# Patient Record
Sex: Male | Born: 1975 | Race: Black or African American | Hispanic: No | Marital: Single | Smoking: Current every day smoker
Health system: Southern US, Community
[De-identification: ages and names within clinical notes are randomized; demographics above are authoritative.]

## PROBLEM LIST (undated history)

## (undated) VITALS — BP 122/92 | HR 90 | Temp 97.0°F | Resp 20 | Ht 75.0 in | Wt 265.0 lb

## (undated) DIAGNOSIS — F1010 Alcohol abuse, uncomplicated: Secondary | ICD-10-CM

## (undated) DIAGNOSIS — F32A Depression, unspecified: Secondary | ICD-10-CM

## (undated) DIAGNOSIS — F1910 Other psychoactive substance abuse, uncomplicated: Secondary | ICD-10-CM

## (undated) DIAGNOSIS — F4310 Post-traumatic stress disorder, unspecified: Secondary | ICD-10-CM

## (undated) DIAGNOSIS — F329 Major depressive disorder, single episode, unspecified: Secondary | ICD-10-CM

## (undated) DIAGNOSIS — I10 Essential (primary) hypertension: Secondary | ICD-10-CM

## (undated) DIAGNOSIS — E785 Hyperlipidemia, unspecified: Secondary | ICD-10-CM

## (undated) HISTORY — PX: HERNIA REPAIR: SHX51

---

## 2010-09-06 ENCOUNTER — Emergency Department (HOSPITAL_COMMUNITY)
Admission: EM | Admit: 2010-09-06 | Discharge: 2010-09-06 | Payer: Self-pay | Source: Home / Self Care | Admitting: Emergency Medicine

## 2010-11-11 ENCOUNTER — Emergency Department (HOSPITAL_COMMUNITY)
Admission: EM | Admit: 2010-11-11 | Discharge: 2010-11-11 | Disposition: A | Discharge status: Home / Self Care | Payer: Medicaid Other | Attending: Emergency Medicine | Admitting: Emergency Medicine

## 2010-11-11 DIAGNOSIS — R55 Syncope and collapse: Secondary | ICD-10-CM | POA: Insufficient documentation

## 2010-11-11 LAB — CBC
Hemoglobin: 13.1 g/dL (ref 13.0–17.0)
MCH: 28.5 pg (ref 26.0–34.0)
MCV: 88.7 fL (ref 78.0–100.0)
Platelets: 202 10*3/uL (ref 150–400)
RBC: 4.6 MIL/uL (ref 4.22–5.81)
WBC: 5.7 10*3/uL (ref 4.0–10.5)

## 2010-11-11 LAB — BASIC METABOLIC PANEL
CO2: 27 mEq/L (ref 19–32)
Chloride: 106 mEq/L (ref 96–112)
Creatinine, Ser: 1.42 mg/dL (ref 0.4–1.5)
GFR calc Af Amer: >60 mL/min (ref >60)
Potassium: 3.9 mEq/L (ref 3.5–5.1)

## 2013-01-15 ENCOUNTER — Emergency Department (HOSPITAL_COMMUNITY): Payer: Medicaid Other

## 2013-01-15 ENCOUNTER — Emergency Department (HOSPITAL_COMMUNITY)
Admission: EM | Admit: 2013-01-15 | Discharge: 2013-01-15 | Disposition: A | Discharge status: Home / Self Care | Payer: Medicaid Other | Attending: Emergency Medicine | Admitting: Emergency Medicine

## 2013-01-15 ENCOUNTER — Encounter (HOSPITAL_COMMUNITY): Payer: Self-pay | Admitting: Emergency Medicine

## 2013-01-15 DIAGNOSIS — Z8781 Personal history of (healed) traumatic fracture: Secondary | ICD-10-CM | POA: Insufficient documentation

## 2013-01-15 DIAGNOSIS — R209 Unspecified disturbances of skin sensation: Secondary | ICD-10-CM | POA: Insufficient documentation

## 2013-01-15 DIAGNOSIS — M545 Low back pain, unspecified: Secondary | ICD-10-CM | POA: Insufficient documentation

## 2013-01-15 DIAGNOSIS — M549 Dorsalgia, unspecified: Secondary | ICD-10-CM

## 2013-01-15 DIAGNOSIS — I10 Essential (primary) hypertension: Secondary | ICD-10-CM | POA: Insufficient documentation

## 2013-01-15 HISTORY — DX: Essential (primary) hypertension: I10

## 2013-01-15 MED ORDER — HYDROCODONE-ACETAMINOPHEN 5-325 MG PO TABS: 1.0000 | ORAL_TABLET | ORAL | Status: DC | PRN

## 2013-01-15 MED ORDER — METHOCARBAMOL 500 MG PO TABS: 500.0000 mg | ORAL_TABLET | Freq: Two times a day (BID) | ORAL | Status: DC

## 2013-01-15 MED ORDER — METHOCARBAMOL 500 MG PO TABS
500.0000 mg | ORAL_TABLET | Freq: Once | ORAL | Status: AC
  Administered 2013-01-15: 500 mg via ORAL
  Filled 2013-01-15: qty 1

## 2013-01-15 MED ORDER — PREDNISONE 20 MG PO TABS
60.0000 mg | ORAL_TABLET | Freq: Once | ORAL | Status: AC
  Administered 2013-01-15: 60 mg via ORAL
  Filled 2013-01-15: qty 3

## 2013-01-15 MED ORDER — OXYCODONE-ACETAMINOPHEN 5-325 MG PO TABS
2.0000 | ORAL_TABLET | Freq: Once | ORAL | Status: AC
  Administered 2013-01-15: 2 via ORAL
  Filled 2013-01-15: qty 2

## 2013-01-15 MED ORDER — HYDROCHLOROTHIAZIDE 12.5 MG PO TABS: 12.5000 mg | ORAL_TABLET | Freq: Every day | ORAL | Status: DC
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
## 2013-01-16 NOTE — ED Provider Notes (Signed)
Medical screening examination/treatment/procedure(s) were performed by non-physician practitioner and as supervising physician I was immediately available for consultation/collaboration.   Montarius Kitagawa III, MD 01/16/13 0825 

## 2013-02-12 ENCOUNTER — Encounter (HOSPITAL_COMMUNITY): Payer: Self-pay | Admitting: *Deleted

## 2013-02-12 ENCOUNTER — Emergency Department (HOSPITAL_COMMUNITY)
Admission: EM | Admit: 2013-02-12 | Discharge: 2013-02-12 | Disposition: A | Discharge status: Home / Self Care | Payer: Medicaid Other | Attending: Emergency Medicine | Admitting: Emergency Medicine

## 2013-02-12 DIAGNOSIS — IMO0002 Reserved for concepts with insufficient information to code with codable children: Secondary | ICD-10-CM | POA: Insufficient documentation

## 2013-02-12 DIAGNOSIS — F17200 Nicotine dependence, unspecified, uncomplicated: Secondary | ICD-10-CM | POA: Insufficient documentation

## 2013-02-12 DIAGNOSIS — M549 Dorsalgia, unspecified: Secondary | ICD-10-CM

## 2013-02-12 DIAGNOSIS — I10 Essential (primary) hypertension: Secondary | ICD-10-CM | POA: Insufficient documentation

## 2013-02-12 DIAGNOSIS — G8929 Other chronic pain: Secondary | ICD-10-CM | POA: Insufficient documentation

## 2013-02-12 DIAGNOSIS — Z79899 Other long term (current) drug therapy: Secondary | ICD-10-CM | POA: Insufficient documentation

## 2013-02-12 DIAGNOSIS — Y9389 Activity, other specified: Secondary | ICD-10-CM | POA: Insufficient documentation

## 2013-02-12 MED ORDER — DIAZEPAM 5 MG PO TABS: 5.0000 mg | ORAL_TABLET | Freq: Two times a day (BID) | ORAL | Status: DC

## 2013-02-12 MED ORDER — OXYCODONE-ACETAMINOPHEN 5-325 MG PO TABS: 1.0000 | ORAL_TABLET | Freq: Four times a day (QID) | ORAL | Status: DC | PRN
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
## 2013-02-12 NOTE — ED Provider Notes (Signed)
Medical screening examination/treatment/procedure(s) were performed by non-physician practitioner and as supervising physician I was immediately available for consultation/collaboration.    Arleen Bar R Darielle Hancher, MD 02/12/13 1602 

## 2013-03-05 ENCOUNTER — Encounter (HOSPITAL_COMMUNITY): Payer: Self-pay | Admitting: Emergency Medicine

## 2013-03-05 ENCOUNTER — Emergency Department (HOSPITAL_COMMUNITY)
Admission: EM | Admit: 2013-03-05 | Discharge: 2013-03-05 | Disposition: A | Discharge status: Home / Self Care | Payer: Medicaid Other | Attending: Emergency Medicine | Admitting: Emergency Medicine

## 2013-03-05 DIAGNOSIS — I10 Essential (primary) hypertension: Secondary | ICD-10-CM | POA: Insufficient documentation

## 2013-03-05 DIAGNOSIS — Z79899 Other long term (current) drug therapy: Secondary | ICD-10-CM | POA: Insufficient documentation

## 2013-03-05 DIAGNOSIS — Z87312 Personal history of (healed) stress fracture: Secondary | ICD-10-CM | POA: Insufficient documentation

## 2013-03-05 DIAGNOSIS — F17200 Nicotine dependence, unspecified, uncomplicated: Secondary | ICD-10-CM | POA: Insufficient documentation

## 2013-03-05 DIAGNOSIS — M549 Dorsalgia, unspecified: Secondary | ICD-10-CM

## 2013-03-05 DIAGNOSIS — M6281 Muscle weakness (generalized): Secondary | ICD-10-CM | POA: Insufficient documentation

## 2013-03-05 DIAGNOSIS — K6289 Other specified diseases of anus and rectum: Secondary | ICD-10-CM | POA: Insufficient documentation

## 2013-03-05 DIAGNOSIS — R634 Abnormal weight loss: Secondary | ICD-10-CM | POA: Insufficient documentation

## 2013-03-05 DIAGNOSIS — R209 Unspecified disturbances of skin sensation: Secondary | ICD-10-CM | POA: Insufficient documentation

## 2013-03-05 DIAGNOSIS — M79609 Pain in unspecified limb: Secondary | ICD-10-CM | POA: Insufficient documentation

## 2013-03-05 DIAGNOSIS — G479 Sleep disorder, unspecified: Secondary | ICD-10-CM | POA: Insufficient documentation

## 2013-03-05 DIAGNOSIS — K921 Melena: Secondary | ICD-10-CM | POA: Insufficient documentation

## 2013-03-05 DIAGNOSIS — R638 Other symptoms and signs concerning food and fluid intake: Secondary | ICD-10-CM | POA: Insufficient documentation

## 2013-03-05 MED ORDER — OXYCODONE-ACETAMINOPHEN 5-325 MG PO TABS: 1.0000 | ORAL_TABLET | Freq: Four times a day (QID) | ORAL | Status: DC | PRN

## 2013-03-05 MED ORDER — DIAZEPAM 5 MG PO TABS: 5.0000 mg | ORAL_TABLET | Freq: Four times a day (QID) | ORAL | Status: DC | PRN

## 2013-03-05 MED ORDER — DIAZEPAM 5 MG PO TABS
5.0000 mg | ORAL_TABLET | Freq: Once | ORAL | Status: AC
  Administered 2013-03-05: 5 mg via ORAL
  Filled 2013-03-05: qty 1

## 2013-03-05 MED ORDER — OXYCODONE-ACETAMINOPHEN 5-325 MG PO TABS
2.0000 | ORAL_TABLET | Freq: Once | ORAL | Status: AC
  Administered 2013-03-05: 2 via ORAL
  Filled 2013-03-05: qty 2

## 2013-03-05 NOTE — ED Provider Notes (Signed)
I saw and evaluated the patient, reviewed the resident's note and I agree with the findings and plan.  Patient here with muscle subtle back pain. No focal neurological findings. No recent injuries. We'll give symmetric tx   Toy Baker, MD 03/05/13 1000
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
## 2013-03-06 NOTE — ED Provider Notes (Signed)
I saw and evaluated the patient, reviewed the resident's note and I agree with the findings and plan.  Jahking Lesser T Zela Sobieski, MD 03/06/13 0758 

## 2013-03-23 ENCOUNTER — Emergency Department (HOSPITAL_COMMUNITY)
Admission: EM | Admit: 2013-03-23 | Discharge: 2013-03-24 | Disposition: A | Discharge status: Other Acute Inpatient Hospital | Payer: Medicaid Other | Attending: Emergency Medicine | Admitting: Emergency Medicine

## 2013-03-23 ENCOUNTER — Encounter (HOSPITAL_COMMUNITY): Payer: Self-pay | Admitting: Emergency Medicine

## 2013-03-23 DIAGNOSIS — F1010 Alcohol abuse, uncomplicated: Secondary | ICD-10-CM | POA: Insufficient documentation

## 2013-03-23 DIAGNOSIS — I10 Essential (primary) hypertension: Secondary | ICD-10-CM | POA: Insufficient documentation

## 2013-03-23 DIAGNOSIS — F3289 Other specified depressive episodes: Secondary | ICD-10-CM | POA: Insufficient documentation

## 2013-03-23 DIAGNOSIS — R1013 Epigastric pain: Secondary | ICD-10-CM | POA: Insufficient documentation

## 2013-03-23 DIAGNOSIS — Z9889 Other specified postprocedural states: Secondary | ICD-10-CM | POA: Insufficient documentation

## 2013-03-23 DIAGNOSIS — F17200 Nicotine dependence, unspecified, uncomplicated: Secondary | ICD-10-CM | POA: Insufficient documentation

## 2013-03-23 DIAGNOSIS — R450 Nervousness: Secondary | ICD-10-CM | POA: Insufficient documentation

## 2013-03-23 DIAGNOSIS — F1920 Other psychoactive substance dependence, uncomplicated: Secondary | ICD-10-CM | POA: Diagnosis present

## 2013-03-23 DIAGNOSIS — F1910 Other psychoactive substance abuse, uncomplicated: Secondary | ICD-10-CM | POA: Insufficient documentation

## 2013-03-23 DIAGNOSIS — F329 Major depressive disorder, single episode, unspecified: Secondary | ICD-10-CM | POA: Insufficient documentation

## 2013-03-23 DIAGNOSIS — F411 Generalized anxiety disorder: Secondary | ICD-10-CM | POA: Insufficient documentation

## 2013-03-23 DIAGNOSIS — F1020 Alcohol dependence, uncomplicated: Secondary | ICD-10-CM | POA: Diagnosis present

## 2013-03-23 DIAGNOSIS — R630 Anorexia: Secondary | ICD-10-CM | POA: Insufficient documentation

## 2013-03-23 DIAGNOSIS — R45851 Suicidal ideations: Secondary | ICD-10-CM | POA: Insufficient documentation

## 2013-03-23 DIAGNOSIS — F1994 Other psychoactive substance use, unspecified with psychoactive substance-induced mood disorder: Secondary | ICD-10-CM | POA: Diagnosis present

## 2013-03-23 DIAGNOSIS — R112 Nausea with vomiting, unspecified: Secondary | ICD-10-CM | POA: Insufficient documentation

## 2013-03-23 HISTORY — DX: Alcohol abuse, uncomplicated: F10.10

## 2013-03-23 HISTORY — DX: Major depressive disorder, single episode, unspecified: F32.9

## 2013-03-23 HISTORY — DX: Depression, unspecified: F32.A

## 2013-03-23 LAB — ACETAMINOPHEN LEVEL: Acetaminophen (Tylenol), Serum: <15 ug/mL (ref 10–30)

## 2013-03-23 LAB — COMPREHENSIVE METABOLIC PANEL
Alkaline Phosphatase: 47 U/L (ref 39–117)
BUN: 10 mg/dL (ref 6–23)
CO2: 27 mEq/L (ref 19–32)
Chloride: 104 mEq/L (ref 96–112)
Creatinine, Ser: 1.42 mg/dL — ABNORMAL HIGH (ref 0.50–1.35)
GFR calc Af Amer: 71 mL/min — ABNORMAL LOW (ref >90)
GFR calc non Af Amer: 61 mL/min — ABNORMAL LOW (ref >90)
Glucose, Bld: 91 mg/dL (ref 70–99)
Potassium: 3.8 mEq/L (ref 3.5–5.1)
Total Bilirubin: 0.3 mg/dL (ref 0.3–1.2)

## 2013-03-23 LAB — ETHANOL: Alcohol, Ethyl (B): 127 mg/dL — ABNORMAL HIGH (ref 0–11)

## 2013-03-23 LAB — CBC
HCT: 45.4 % (ref 39.0–52.0)
Hemoglobin: 15.5 g/dL (ref 13.0–17.0)
MCV: 90.4 fL (ref 78.0–100.0)
RDW: 13.8 % (ref 11.5–15.5)
WBC: 5.2 10*3/uL (ref 4.0–10.5)

## 2013-03-23 LAB — SALICYLATE LEVEL: Salicylate Lvl: <2 mg/dL — ABNORMAL LOW (ref 2.8–20.0)

## 2013-03-23 LAB — RAPID URINE DRUG SCREEN, HOSP PERFORMED
Barbiturates: NOT DETECTED
Benzodiazepines: NOT DETECTED

## 2013-03-23 MED ORDER — FOLIC ACID 1 MG PO TABS
1.0000 mg | ORAL_TABLET | Freq: Every day | ORAL | Status: DC
  Administered 2013-03-23: 1 mg via ORAL
  Filled 2013-03-23: qty 1

## 2013-03-23 MED ORDER — ALUM & MAG HYDROXIDE-SIMETH 200-200-20 MG/5ML PO SUSP
30.0000 mL | ORAL | Status: DC | PRN
  Administered 2013-03-24: 30 mL via ORAL
  Filled 2013-03-23: qty 30

## 2013-03-23 MED ORDER — ONDANSETRON HCL 4 MG/2ML IJ SOLN
4.0000 mg | Freq: Once | INTRAMUSCULAR | Status: AC
  Administered 2013-03-23: 4 mg via INTRAVENOUS
  Filled 2013-03-23: qty 2

## 2013-03-23 MED ORDER — SODIUM CHLORIDE 0.9 % IV BOLUS (SEPSIS)
1000.0000 mL | Freq: Once | INTRAVENOUS | Status: AC
  Administered 2013-03-23: 1000 mL via INTRAVENOUS

## 2013-03-23 MED ORDER — ONDANSETRON HCL 4 MG PO TABS
4.0000 mg | ORAL_TABLET | Freq: Three times a day (TID) | ORAL | Status: DC | PRN
  Administered 2013-03-24: 4 mg via ORAL
  Filled 2013-03-23: qty 1

## 2013-03-23 MED ORDER — THIAMINE HCL 100 MG/ML IJ SOLN
100.0000 mg | Freq: Every day | INTRAMUSCULAR | Status: DC
  Administered 2013-03-23: 100 mg via INTRAVENOUS
  Filled 2013-03-23: qty 2

## 2013-03-23 MED ORDER — LORAZEPAM 1 MG PO TABS: 1.0000 mg | ORAL_TABLET | Freq: Four times a day (QID) | ORAL | Status: DC | PRN

## 2013-03-23 MED ORDER — ADULT MULTIVITAMIN W/MINERALS CH
1.0000 | ORAL_TABLET | Freq: Every day | ORAL | Status: DC
Start: 2013-03-23 — End: 2013-03-24
  Administered 2013-03-23: 1 via ORAL
  Filled 2013-03-23: qty 1

## 2013-03-23 MED ORDER — IBUPROFEN 200 MG PO TABS
600.0000 mg | ORAL_TABLET | Freq: Three times a day (TID) | ORAL | Status: DC | PRN
  Administered 2013-03-24: 600 mg via ORAL
  Filled 2013-03-23: qty 3

## 2013-03-23 MED ORDER — LORAZEPAM 2 MG/ML IJ SOLN
1.0000 mg | Freq: Four times a day (QID) | INTRAMUSCULAR | Status: DC | PRN
  Administered 2013-03-23: 1 mg via INTRAVENOUS
  Filled 2013-03-23: qty 1

## 2013-03-23 MED ORDER — NICOTINE 21 MG/24HR TD PT24
21.0000 mg | MEDICATED_PATCH | Freq: Every day | TRANSDERMAL | Status: DC
  Administered 2013-03-23: 21 mg via TRANSDERMAL
  Filled 2013-03-23: qty 1

## 2013-03-23 MED ORDER — VITAMIN B-1 100 MG PO TABS: 100.0000 mg | ORAL_TABLET | Freq: Every day | ORAL | Status: DC

## 2013-03-23 MED ORDER — ZOLPIDEM TARTRATE 5 MG PO TABS
5.0000 mg | ORAL_TABLET | Freq: Every evening | ORAL | Status: DC | PRN
  Administered 2013-03-24: 5 mg via ORAL
  Filled 2013-03-23: qty 1

## 2013-03-23 NOTE — ED Notes (Addendum)
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 Pt states he has chronic back pain, out of meds. +nausea/vomiting. Last drink just PTA pt states he has consumed 5th of [place] and a 5th of Vodka, Pt denies SI/HI

## 2013-03-23 NOTE — ED Notes (Signed)
Pt belongings:Multi color polo shirt, levi blue jeans, black belt, black T-shirt, black boxers, black socks, black and white sneakers, red and blue hat, and headphones. Pt belongings put in two belongings bags and placed in locker #33. Pt's wallet and diamond earrings will be locked in safe.

## 2013-03-23 NOTE — ED Provider Notes (Signed)
CSN: [id number]     Arrival date & time 03/23/13  2004 History     First MD Initiated Contact with Patient 03/23/13 2009     Chief Complaint  Patient presents with  . Medical Clearance   (Consider location/radiation/quality/duration/timing/severity/associated sxs/prior Treatment) HPI Comments: 37 year old male past medical history of hypertension and alcohol abuse presents emergency department voluntarily requesting detox from alcohol and worsening depression. Patient states he has been sober for the past 9 months, however 4 days ago his aunt was killed up in [place] and he relapsed. He has been drinking about $100 worth of alcohol each day. Admits to suicidal ideation where he thought about "drinking himself to death". States he was embarrassed to tell triage he had SI. States his depression is worsening and he is very tearful. Currently he is complaining of nausea and vomiting. He is very anxious. Last alcohol intake was just prior to arrival. Also admits to marijuana use. Denies homicidal ideation.  The history is provided by the patient.    Past Medical History  Diagnosis Date  . Hypertension   . Alcohol abuse    Past Surgical History  Procedure Laterality Date  . Hernia repair     No family history on file. History  Substance Use Topics  . Smoking status: Current Every Day Smoker  . Smokeless tobacco: Not on file  . Alcohol Use: Yes    Review of Systems  Constitutional: Positive for appetite change.  Respiratory: Negative for shortness of breath.   Cardiovascular: Negative for chest pain.  Gastrointestinal: Positive for nausea, vomiting and abdominal pain.  Psychiatric/Behavioral: Positive for suicidal ideas and dysphoric mood. The patient is nervous/anxious.   All other systems reviewed and are negative.    Allergies  Review of patient's allergies indicates no known allergies.  Home Medications   Current Outpatient Rx  Name  Route  Sig  Dispense  Refill  .  diazepam (VALIUM) 5 MG tablet   Oral   Take 1 tablet (5 mg total) by mouth every 6 (six) hours as needed for anxiety (spasms).   10 tablet   0   . Multiple Vitamin (MULTIVITAMIN WITH MINERALS) TABS   Oral   Take 1 tablet by mouth daily.         Marland Kitchen oxyCODONE-acetaminophen (PERCOCET) 5-325 MG per tablet   Oral   Take 1 tablet by mouth every 6 (six) hours as needed for pain.   10 tablet   0    BP 149/131  Pulse 93  Temp(Src) 98.4 F (36.9 C) (Oral)  Resp 18  Wt 266 lb (120.657 kg)  BMI 33.25 kg/m2  SpO2 97% Physical Exam  Nursing note and vitals reviewed. Constitutional: He is oriented to person, place, and time. He appears well-developed and well-nourished. No distress.  HENT:  Head: Normocephalic and atraumatic.  Mouth/Throat: Oropharynx is clear and moist.  Eyes: Conjunctivae and EOM are normal. Pupils are equal, round, and reactive to light.  Neck: Normal range of motion. Neck supple.  Cardiovascular: Normal rate, regular rhythm and normal heart sounds.   Pulmonary/Chest: Effort normal and breath sounds normal.  Abdominal: Soft. Normal appearance and bowel sounds are normal. He exhibits no distension and no mass. There is tenderness in the epigastric area. There is no rigidity, no rebound and no guarding.  No peritoneal signs.  Musculoskeletal: Normal range of motion. He exhibits no edema.  Neurological: He is alert and oriented to person, place, and time.  Skin: Skin is warm and  dry. He is not diaphoretic.  Psychiatric: His speech is normal and behavior is normal. His mood appears anxious. He exhibits a depressed mood. He expresses suicidal ideation. He expresses no homicidal ideation. He expresses suicidal plans.  Tearful.    ED Course   Procedures (including critical care time)  Labs Reviewed  COMPREHENSIVE METABOLIC PANEL - Abnormal; Notable for the following:    Creatinine, Ser 1.42 (*)    Albumin 3.3 (*)    GFR calc non Af Amer 61 (*)    GFR calc Af Amer  71 (*)    All other components within normal limits  ETHANOL - Abnormal; Notable for the following:    Alcohol, Ethyl (B) 127 (*)    All other components within normal limits  SALICYLATE LEVEL - Abnormal; Notable for the following:    Salicylate Lvl <2.0 (*)    All other components within normal limits  ACETAMINOPHEN LEVEL  CBC  URINE RAPID DRUG SCREEN (HOSP PERFORMED)   No results found. No diagnosis found.  MDM  Patient here for alcohol detox, worsening depression and suicidal ideation. Psych labs pending. CIWA protocol. I spoke with ACT team who will evaluate patient.  Patient medically cleared.  Trevor Mace, PA-C 03/24/13 [phone]

## 2013-03-23 NOTE — ED Notes (Signed)
Pt belongings placed in locker 33 

## 2013-03-23 NOTE — ED Notes (Signed)
Pt wallet with three credit cards, no money, two diamond stud earrings, and a samsung cell phone were inventoried,  Bagged,and taken to security where they were locked in the safe. The key was taped to the inventory sheet and placed in the pt's chart

## 2013-03-24 ENCOUNTER — Inpatient Hospital Stay (HOSPITAL_COMMUNITY)
Admission: AD | Admit: 2013-03-24 | Discharge: 2013-03-28 | DRG: 897 | Disposition: A | Discharge status: Home / Self Care | Payer: Medicaid Other | Source: Ambulatory Visit | Attending: Psychiatry | Admitting: Psychiatry

## 2013-03-24 ENCOUNTER — Encounter (HOSPITAL_COMMUNITY): Payer: Self-pay | Admitting: *Deleted

## 2013-03-24 ENCOUNTER — Encounter (HOSPITAL_COMMUNITY): Payer: Self-pay

## 2013-03-24 DIAGNOSIS — F4310 Post-traumatic stress disorder, unspecified: Secondary | ICD-10-CM | POA: Diagnosis present

## 2013-03-24 DIAGNOSIS — F1994 Other psychoactive substance use, unspecified with psychoactive substance-induced mood disorder: Secondary | ICD-10-CM | POA: Diagnosis present

## 2013-03-24 DIAGNOSIS — R45851 Suicidal ideations: Secondary | ICD-10-CM

## 2013-03-24 DIAGNOSIS — F329 Major depressive disorder, single episode, unspecified: Secondary | ICD-10-CM

## 2013-03-24 DIAGNOSIS — I10 Essential (primary) hypertension: Secondary | ICD-10-CM | POA: Diagnosis present

## 2013-03-24 DIAGNOSIS — F1010 Alcohol abuse, uncomplicated: Secondary | ICD-10-CM

## 2013-03-24 DIAGNOSIS — Z79899 Other long term (current) drug therapy: Secondary | ICD-10-CM

## 2013-03-24 DIAGNOSIS — F1920 Other psychoactive substance dependence, uncomplicated: Secondary | ICD-10-CM | POA: Diagnosis present

## 2013-03-24 DIAGNOSIS — F1020 Alcohol dependence, uncomplicated: Secondary | ICD-10-CM | POA: Diagnosis present

## 2013-03-24 MED ORDER — CHLORDIAZEPOXIDE HCL 25 MG PO CAPS
25.0000 mg | ORAL_CAPSULE | Freq: Every day | ORAL | Status: AC
  Administered 2013-03-28: 25 mg via ORAL
  Filled 2013-03-24: qty 1

## 2013-03-24 MED ORDER — CHLORDIAZEPOXIDE HCL 25 MG PO CAPS
25.0000 mg | ORAL_CAPSULE | ORAL | Status: AC
  Administered 2013-03-27 (×2): 25 mg via ORAL
  Filled 2013-03-24 (×2): qty 1

## 2013-03-24 MED ORDER — IBUPROFEN 600 MG PO TABS
600.0000 mg | ORAL_TABLET | Freq: Four times a day (QID) | ORAL | Status: DC | PRN
  Administered 2013-03-24 – 2013-03-25 (×2): 600 mg via ORAL
  Filled 2013-03-24 (×2): qty 1

## 2013-03-24 MED ORDER — VITAMIN B-1 100 MG PO TABS
100.0000 mg | ORAL_TABLET | Freq: Every day | ORAL | Status: DC
  Administered 2013-03-25 – 2013-03-28 (×4): 100 mg via ORAL
  Filled 2013-03-24 (×6): qty 1

## 2013-03-24 MED ORDER — CHLORDIAZEPOXIDE HCL 25 MG PO CAPS
25.0000 mg | ORAL_CAPSULE | Freq: Once | ORAL | Status: DC
  Filled 2013-03-24 (×2): qty 1

## 2013-03-24 MED ORDER — DIVALPROEX SODIUM ER 500 MG PO TB24
500.0000 mg | ORAL_TABLET | Freq: Every day | ORAL | Status: DC
  Administered 2013-03-24 – 2013-03-27 (×4): 500 mg via ORAL
  Filled 2013-03-24 (×6): qty 1
  Filled 2013-03-24: qty 3

## 2013-03-24 MED ORDER — ONDANSETRON 4 MG PO TBDP: 4.0000 mg | ORAL_TABLET | Freq: Four times a day (QID) | ORAL | Status: AC | PRN

## 2013-03-24 MED ORDER — CHLORDIAZEPOXIDE HCL 25 MG PO CAPS
25.0000 mg | ORAL_CAPSULE | Freq: Three times a day (TID) | ORAL | Status: AC
  Administered 2013-03-26 (×3): 25 mg via ORAL
  Filled 2013-03-24 (×2): qty 1

## 2013-03-24 MED ORDER — CHLORDIAZEPOXIDE HCL 25 MG PO CAPS
25.0000 mg | ORAL_CAPSULE | Freq: Four times a day (QID) | ORAL | Status: AC | PRN
  Administered 2013-03-25: 25 mg via ORAL
  Filled 2013-03-24 (×2): qty 1

## 2013-03-24 MED ORDER — CHLORDIAZEPOXIDE HCL 25 MG PO CAPS
25.0000 mg | ORAL_CAPSULE | Freq: Four times a day (QID) | ORAL | Status: AC
  Administered 2013-03-24 – 2013-03-25 (×5): 25 mg via ORAL
  Filled 2013-03-24 (×4): qty 1

## 2013-03-24 MED ORDER — THIAMINE HCL 100 MG/ML IJ SOLN
100.0000 mg | Freq: Once | INTRAMUSCULAR | Status: AC
  Administered 2013-03-24: 100 mg via INTRAMUSCULAR

## 2013-03-24 MED ORDER — ADULT MULTIVITAMIN W/MINERALS CH
1.0000 | ORAL_TABLET | Freq: Every day | ORAL | Status: DC
  Administered 2013-03-24 – 2013-03-28 (×5): 1 via ORAL
  Filled 2013-03-24 (×4): qty 1
  Filled 2013-03-24: qty 3
  Filled 2013-03-24 (×2): qty 1

## 2013-03-24 MED ORDER — HYDROXYZINE HCL 25 MG PO TABS
25.0000 mg | ORAL_TABLET | Freq: Four times a day (QID) | ORAL | Status: AC | PRN
  Administered 2013-03-25: 25 mg via ORAL

## 2013-03-24 MED ORDER — QUETIAPINE FUMARATE 100 MG PO TABS
100.0000 mg | ORAL_TABLET | Freq: Every day | ORAL | Status: DC
  Administered 2013-03-24 – 2013-03-27 (×4): 100 mg via ORAL
  Filled 2013-03-24 (×2): qty 1
  Filled 2013-03-24: qty 3
  Filled 2013-03-24 (×4): qty 1

## 2013-03-24 MED ORDER — LOPERAMIDE HCL 2 MG PO CAPS
2.0000 mg | ORAL_CAPSULE | ORAL | Status: AC | PRN
  Administered 2013-03-25: 4 mg via ORAL

## 2013-03-24 NOTE — Progress Notes (Signed)
Psychoeducational Group Note  Date: 03/24/2013 Time:  1015  Group Topic/Focus:  Identifying Needs:   The focus of this group is to help patients identify their personal needs that have been historically problematic and identify healthy behaviors to address their needs.  Participation Level:  Active  Participation Quality:  Appropriate  Affect:  Appropriate  Cognitive:  Appropriate  Insight:  Improving  Engagement in Group:  Engaged  Additional Comments:    Eric Livingston A 
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
## 2013-03-24 NOTE — ED Provider Notes (Signed)
Medical screening examination/treatment/procedure(s) were performed by non-physician practitioner and as supervising physician I was immediately available for consultation/collaboration.   Richardean Canal, MD 03/24/13 [phone]

## 2013-03-24 NOTE — ED Notes (Signed)
ACT team in pt's room

## 2013-03-24 NOTE — [facility] Suicide Risk Assessment (Signed)
Suicide Risk Assessment  Admission Assessment     Nursing information obtained from:    Demographic factors:    Current Mental Status:    Loss Factors:    Historical Factors:    Risk Reduction Factors:     CLINICAL FACTORS:   Depression:   Comorbid alcohol abuse/dependence Impulsivity Alcohol/Substance Abuse/Dependencies  COGNITIVE FEATURES THAT CONTRIBUTE TO RISK:  Closed-mindedness Polarized thinking Thought constriction (tunnel vision)    SUICIDE RISK:   Moderate:  Frequent suicidal ideation with limited intensity, and duration, some specificity in terms of plans, no associated intent, good self-control, limited dysphoria/symptomatology, some risk factors present, and identifiable protective factors, including available and accessible social support.  PLAN OF CARE: Supportive approach/coping skills/relapse prevention                               Librium Detox/reassess and address the co morbidities  I certify that inpatient services furnished can reasonably be expected to improve the patient's condition.  Eric Livingston A 03/24/2013, 6:06 PM

## 2013-03-24 NOTE — B[facility] Counselor (Signed)
Pt accepted by Leonette Most PA per Tresa Endo to [facility] and will go by security to hall [phone].  Admit paper work faxed in.
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
## 2013-03-24 NOTE — Consult Note (Signed)
Agree with the plan.

## 2013-03-24 NOTE — Progress Notes (Signed)
D) Pt has been attending the groups and interacting with his peers. Pt. Admits to thoughts of SI and denies HI. Rates his depression and hopelessness both at a 9. States that he feels angry and hurt over his girlfriend and how he has been treated. Talked about the father of his girlfriends children coming and staying at his house all the time and spending time with the children, but Pt gets upset that this "man is sitting in my living room, that I am paying the rent for, while I am at work earning the money". Verbalizes feeling disrespected and belittled. A) Praised for sharing his feelings. Encouraged him to look at his options as to what will be the best and the healthiest for him. Verbal contract made with Pt for his safety. R) Pt contracts for his safety in the hospital. Feels unsafe outside of the hospital.
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
## 2013-03-24 NOTE — Consult Note (Signed)
Reason for Consult:See ED admission note Referring Physician: ED providers  Eric Livingston is an 37 y.o. male.  HPI: hx of polysubstance dependence on alcohol and cocaine binging on ETOH per ED admission note and seeking help with detox and treatment.  Past Medical History  Diagnosis Date  . Hypertension   . Alcohol abuse   . Depression     Past Surgical History  Procedure Laterality Date  . Hernia repair      No family history on file.  Social History:  reports that he has been smoking.  He does not have any smokeless tobacco history on file. He reports that  drinks alcohol. He reports that he uses illicit drugs (Marijuana).  Allergies: No Known Allergies  Medications: I have reviewed the patient's current medications.  Results for orders placed during the hospital encounter of 03/23/13 (from the past 48 hour(s))  ACETAMINOPHEN LEVEL     Status: None   Collection Time    03/23/13  8:50 PM      Result Value Range   Acetaminophen (Tylenol), Serum <15.0  10 - 30 ug/mL   Comment:            THERAPEUTIC CONCENTRATIONS VARY     SIGNIFICANTLY. A RANGE OF 10-30     ug/mL MAY BE AN EFFECTIVE     CONCENTRATION FOR MANY PATIENTS.     HOWEVER, SOME ARE BEST TREATED     AT CONCENTRATIONS OUTSIDE THIS     RANGE.     ACETAMINOPHEN CONCENTRATIONS     >150 ug/mL AT 4 HOURS AFTER     INGESTION AND >50 ug/mL AT 12     HOURS AFTER INGESTION ARE     OFTEN ASSOCIATED WITH TOXIC     REACTIONS.  CBC     Status: None   Collection Time    03/23/13  8:50 PM      Result Value Range   WBC 5.2  4.0 - 10.5 K/uL   RBC 5.02  4.22 - 5.81 MIL/uL   Hemoglobin 15.5  13.0 - 17.0 g/dL   HCT 19.1  47.8 - 29.5 %   MCV 90.4  78.0 - 100.0 fL   MCH 30.9  26.0 - 34.0 pg   MCHC 34.1  30.0 - 36.0 g/dL   RDW 62.1  30.8 - 65.7 %   Platelets 270  150 - 400 K/uL  COMPREHENSIVE METABOLIC PANEL     Status: Abnormal   Collection Time    03/23/13  8:50 PM      Result Value Range   Sodium 142  135 - 145  mEq/L   Potassium 3.8  3.5 - 5.1 mEq/L   Chloride 104  96 - 112 mEq/L   CO2 27  19 - 32 mEq/L   Glucose, Bld 91  70 - 99 mg/dL   BUN 10  6 - 23 mg/dL   Creatinine, Ser 8.46 (*) 0.50 - 1.35 mg/dL   Calcium 8.7  8.4 - 96.2 mg/dL   Total Protein 6.4  6.0 - 8.3 g/dL   Albumin 3.3 (*) 3.5 - 5.2 g/dL   AST 25  0 - 37 U/L   ALT 30  0 - 53 U/L   Alkaline Phosphatase 47  39 - 117 U/L   Total Bilirubin 0.3  0.3 - 1.2 mg/dL   GFR calc non Af Amer 61 (*) >90 mL/min   GFR calc Af Amer 71 (*) >90 mL/min   Comment:  The eGFR has been calculated     using the CKD EPI equation.     This calculation has not been     validated in all clinical     situations.     eGFR's persistently     <90 mL/min signify     possible Chronic Kidney Disease.  ETHANOL     Status: Abnormal   Collection Time    03/23/13  8:50 PM      Result Value Range   Alcohol, Ethyl (B) 127 (*) 0 - 11 mg/dL   Comment:            LOWEST DETECTABLE LIMIT FOR     SERUM ALCOHOL IS 11 mg/dL     FOR MEDICAL PURPOSES ONLY  SALICYLATE LEVEL     Status: Abnormal   Collection Time    03/23/13  8:50 PM      Result Value Range   Salicylate Lvl <2.0 (*) 2.8 - 20.0 mg/dL  URINE RAPID DRUG SCREEN (HOSP PERFORMED)     Status: None   Collection Time    03/23/13  9:41 PM      Result Value Range   Opiates NONE DETECTED  NONE DETECTED   Cocaine NONE DETECTED  NONE DETECTED   Benzodiazepines NONE DETECTED  NONE DETECTED   Amphetamines NONE DETECTED  NONE DETECTED   Tetrahydrocannabinol NONE DETECTED  NONE DETECTED   Barbiturates NONE DETECTED  NONE DETECTED   Comment:            DRUG SCREEN FOR MEDICAL PURPOSES     ONLY.  IF CONFIRMATION IS NEEDED     FOR ANY PURPOSE, NOTIFY LAB     WITHIN 5 DAYS.                LOWEST DETECTABLE LIMITS     FOR URINE DRUG SCREEN     Drug Class       Cutoff (ng/mL)     Amphetamine      1000     Barbiturate      200     Benzodiazepine   200     Tricyclics       300     Opiates           300     Cocaine          300     THC              50    No results found.  Review of Systems  Constitutional: Negative.   HENT: Negative.   Eyes: Negative.   Respiratory: Negative.        Smoker  Cardiovascular:       Hypertensive  Gastrointestinal: Negative.   Genitourinary: Negative.   Musculoskeletal: Positive for back pain (pt has been in ed past 3 months for rx of percocet and valium for claims of T11 fx in past).  Skin: Negative.   Neurological: Negative.   Endo/Heme/Allergies: Negative.   Psychiatric/Behavioral: Positive for depression, suicidal ideas and substance abuse (Admits to "Heavy" cocaine addiction-last used last week as well as Etoh dependence for which he has undergone detox in past in [place]). Negative for hallucinations and memory loss. The patient is not nervous/anxious and does not have insomnia.    Blood pressure 139/98, pulse 74, temperature 98.2 F (36.8 C), temperature source Oral, resp. rate 16, weight 120.657 kg (266 lb), SpO2 100.00%. Physical Exam  Constitutional: He is oriented to person, place, and  time. He appears well-developed and well-nourished.  HENT:  Head: Normocephalic and atraumatic.  Right Ear: External ear normal.  Left Ear: External ear normal.  Nose: Nose normal.  Eyes: Conjunctivae and EOM are normal. Pupils are equal, round, and reactive to light. Right eye exhibits no discharge. Left eye exhibits no discharge. No scleral icterus.  Neck: Normal range of motion. No JVD present. No tracheal deviation present.  Cardiovascular: Normal rate and regular rhythm.   Respiratory: Effort normal and breath sounds normal. No stridor. No respiratory distress. He has no wheezes.  GI:  DEFERRED  Genitourinary:  DEFERRED  Musculoskeletal: Normal range of motion.  Neurological: He is alert and oriented to person, place, and time. No cranial nerve deficit. Coordination normal.  Skin: Skin is warm and dry.  Psychiatric: Thought content normal.  His speech is delayed (decreased rate and tone). He is slowed. Thought content is not paranoid and not delusional. Cognition and memory are normal. He expresses impulsivity. He exhibits a depressed mood. He expresses no homicidal and no suicidal (has had intermittently but denies currently) ideation. He expresses no suicidal plans and no homicidal plans.    Assessment/Plan: A- AXIS I-Polysubstance dependence (alcohol and cocaine)                Alcohol Dependence syndrome                Substance induced Mood Disorder                Suicidal Thoughts      AXIS II_Deferred      AXIS III Hypertension                  Hx T11 compression with c/o pain      AXIS IV- Family relationship problems      AXIS V- GAF 30  P- Admit to [facility] for Detox and treatment referral after detox   KOBER, CHARLES E 03/24/2013, 1:00 AM

## 2013-03-24 NOTE — ED Notes (Signed)
Per Victorino Dike, RN at [facility] ([facility]) the RN Jake Samples gave report to is currently with a pt that needed immediate attention. Writer was calling to let her know that security is changing shifts and pt cannot come right now. At this time pt will transport after 0800 am. Pam will call us if any different.

## 2013-03-24 NOTE — Tx Team (Signed)
Initial Interdisciplinary Treatment Plan  PATIENT STRENGTHS: (choose at least two) Average or above average intelligence Motivation for treatment/growth Supportive family/friends  PATIENT STRESSORS: Marital or family conflict Substance abuse   PROBLEM LIST: Problem List/Patient Goals Date to be addressed Date deferred Reason deferred Estimated date of resolution  polysub abuse/dependence  03-24-13                                                      DISCHARGE CRITERIA:  Improved stabilization in mood, thinking, and/or behavior Verbal commitment to aftercare and medication compliance Withdrawal symptoms are absent or subacute and managed without 24-hour nursing intervention  PRELIMINARY DISCHARGE PLAN: Attend aftercare/continuing care group Attend 12-step recovery group  PATIENT/FAMIILY INVOLVEMENT: This treatment plan has been presented to and reviewed with the patient, Eric Livingston, and/or family member,  The patient and family have been given the opportunity to ask questions and make suggestions.  Valente David 03/24/2013, 7:42 AM

## 2013-03-24 NOTE — BH Assessment (Addendum)
Assessment Note  Eric Livingston is a 37 y.o. male who presents to [facility] with SA/Depression/SI.  Pt denies HI/Psych.  Pt reports the following: pt states he relapsed on alcohol and THC, 2 wks ago.  Pt consumes 6-40's and 2-1/5's daily for the last 2wks and states he's been sober for 8 mos, also using 2 blunts, daily, last use for both substances was 03/23/13.  Pt says yesterday and this morning, he'd been feeling SI, no plan, but told this writer that he has been drinking an excessive amount of alcohol.  Pt says precip events for relapse on alcohol: (1) death in the family--aunt died; (2) other family related problems; (3) stress in the home, states too many people living in his home.  Pt denies and current SI, no plan or intent to harm self.  Pt c/o w/d sxs: nausea, "shakes", sweats, headache, sensitive to light.  Pt has past inpt admissions in [place] and [facility] in [place] both admissions for alcohol dependence.  Pt denies seizure hx but has blackouts due to alcohol use; past hx of alcohol induced AVH.  Pt is requesting detox for alcohol and thc use.    Axis I: Alcohol Abuse; Cannabis Abuse Axis II: Deferred Axis III:  Past Medical History  Diagnosis Date  . Hypertension   . Alcohol abuse   . Depression    Axis IV: housing problems, other psychosocial or environmental problems, problems related to social environment and problems with primary support group Axis V: 41-50 serious symptoms  Past Medical History:  Past Medical History  Diagnosis Date  . Hypertension   . Alcohol abuse   . Depression     Past Surgical History  Procedure Laterality Date  . Hernia repair      Family History: No family history on file.  Social History:  reports that he has been smoking.  He does not have any smokeless tobacco history on file. He reports that  drinks alcohol. He reports that he uses illicit drugs (Marijuana).  Additional Social History:  Alcohol / Drug Use Pain Medications: See MAR   Prescriptions: See MAR  Over the Counter: See MAR  History of alcohol / drug use?: Yes Longest period of sobriety (when/how long): 8 Mos Negative Consequences of Use: Personal relationships Withdrawal Symptoms: Blackouts;Nausea / Vomiting;Sweats;Tremors;Other (Comment) (Headache, senstive to light) Substance #1 Name of Substance 1: Alcohol  1 - Age of First Use: Teens  1 - Amount (size/oz): 6-40's; 2-1/5's  1 - Frequency: Daily  1 - Duration: 2wks  1 - Last Use / Amount: 03/23/13 Substance #2 Name of Substance 2: THC  2 - Age of First Use: Teens  2 - Amount (size/oz): 2 Blunts  2 - Frequency: Daily  2 - Duration: 2 wks  2 - Last Use / Amount: 03/23/13  CIWA: CIWA-Ar BP: 139/98 mmHg Pulse Rate: 74 Nausea and Vomiting: no nausea and no vomiting Tactile Disturbances: none Tremor: not visible, but can be felt fingertip to fingertip Auditory Disturbances: not present Paroxysmal Sweats: barely perceptible sweating, palms moist Visual Disturbances: very mild sensitivity Anxiety: no anxiety, at ease Headache, Fullness in Head: mild Agitation: normal activity Orientation and Clouding of Sensorium: oriented and can do serial additions CIWA-Ar Total: 5 COWS:    Allergies: No Known Allergies  Home Medications:  (Not in a hospital admission)  OB/GYN Status:  No LMP for male patient.  General Assessment Data Location of Assessment: [facility] Is this a Tele or Face-to-Face Assessment?: Face-to-Face Is this  an Initial Assessment or a Re-assessment for this encounter?: Initial Assessment Living Arrangements: Non-relatives/Friends Can pt return to current living arrangement?: Yes Admission Status: Voluntary Is patient capable of signing voluntary admission?: Yes Transfer from: Acute Hospital Referral Source: MD  Education Status Is patient currently in school?: No Current Grade: None  Highest grade of school patient has completed: None  Name of school: None  Contact person:  None   Risk to self Suicidal Ideation: No-Not Currently/Within Last 6 Months Suicidal Intent: No-Not Currently/Within Last 6 Months Is patient at risk for suicide?: No Suicidal Plan?: No-Not Currently/Within Last 6 Months Access to Means: No What has been your use of drugs/alcohol within the last 12 months?: Abusing: alcohol; thc  Previous Attempts/Gestures: No How many times?: 0 Other Self Harm Risks: None  Triggers for Past Attempts: None known Intentional Self Injurious Behavior: None Family Suicide History: No Recent stressful life event(s):  (Recent death in family, family problems, relapse on alcohol/) Persecutory voices/beliefs?: No Depression: Yes Depression Symptoms: Loss of interest in usual pleasures Substance abuse history and/or treatment for substance abuse?: Yes Suicide prevention information given to non-admitted patients: Not applicable  Risk to Others Homicidal Ideation: No Thoughts of Harm to Others: No Current Homicidal Intent: No Current Homicidal Plan: No Access to Homicidal Means: No Identified Victim: None  History of harm to others?: No Assessment of Violence: None Noted Violent Behavior Description: None  Does patient have access to weapons?: No Criminal Charges Pending?: No Does patient have a court date: No  Psychosis Hallucinations: None noted Delusions: None noted  Mental Status Report Appear/Hygiene: Other (Comment) (Appropriate ) Eye Contact: Fair Motor Activity: Tremors;Unremarkable Speech: Logical/coherent Level of Consciousness: Alert Mood: Depressed Affect: Depressed Anxiety Level: None Thought Processes: Coherent;Relevant Judgement: Unimpaired Orientation: Person;Place;Time;Situation Obsessive Compulsive Thoughts/Behaviors: None  Cognitive Functioning Concentration: Normal Memory: Recent Intact;Remote Intact IQ: Average Insight: Fair Impulse Control: Fair Appetite: Good Weight Loss: 0 Weight Gain: 0 Sleep: No  Change Total Hours of Sleep: 6 Vegetative Symptoms: None  ADLScreening [facility] Assessment Services) Patient's cognitive ability adequate to safely complete daily activities?: Yes Patient able to express need for assistance with ADLs?: Yes Independently performs ADLs?: Yes (appropriate for developmental age)  Prior Inpatient Therapy Prior Inpatient Therapy: Yes Prior Therapy Dates: 2013 and other dates  Prior Therapy Facilty/Provider(s): [place]; [facility]  Reason for Treatment: Detox/Rehab   Prior Outpatient Therapy Prior Outpatient Therapy: Yes Prior Therapy Dates: Various  Prior Therapy Facilty/Provider([facility]): [facility] Reason for Treatment: Detox  ADL Screening (condition at time of admission) Patient's cognitive ability adequate to safely complete daily activities?: Yes Is the patient deaf or have difficulty hearing?: No Does the patient have difficulty concentrating, remembering, or making decisions?: No Patient able to express need for assistance with ADLs?: Yes Does the patient have difficulty dressing or bathing?: No Independently performs ADLs?: Yes (appropriate for developmental age) Does the patient have difficulty walking or climbing stairs?: No Weakness of Legs: None Weakness of Arms/Hands: None     Therapy Consults (therapy consults require a physician order) PT Evaluation Needed: No OT Evalulation Needed: No SLP Evaluation Needed: No Abuse/Neglect Assessment (Assessment to be complete while patient is alone) Physical Abuse: Denies Verbal Abuse: Denies Sexual Abuse: Denies Exploitation of patient/patient's resources: Denies Self-Neglect: Denies Values / Beliefs Cultural Requests During Hospitalization: None Spiritual Requests During Hospitalization: None Consults Spiritual Care Consult Needed: No Social Work Consult Needed: No Merchant [employer] officer (For Healthcare) Advance Directive: Patient does not have advance directive;Patient  would  not like information Pre-existing out of facility DNR order (yellow form or pink MOST form): No Nutrition Screen- [facility] Adult/[facility] Patient's home diet: Regular  Additional Information 1:1 In Past 12 Months?: No CIRT Risk: No Elopement Risk: No Does patient have medical clearance?: Yes     Disposition:  Disposition Initial Assessment Completed for this Encounter: Yes Disposition of Patient: Inpatient treatment program;Referred to [facility] ) Type of inpatient treatment program: Adult Patient referred to: Other (Comment) [facility] )  On Site Evaluation by:   Reviewed with Physician:    Murrell Redden 03/24/2013 12:56 AM

## 2013-03-25 DIAGNOSIS — I10 Essential (primary) hypertension: Secondary | ICD-10-CM | POA: Diagnosis present

## 2013-03-25 MED ORDER — LISINOPRIL 10 MG PO TABS
ORAL_TABLET | ORAL | Status: AC
  Filled 2013-03-25: qty 1

## 2013-03-25 MED ORDER — LISINOPRIL 10 MG PO TABS
10.0000 mg | ORAL_TABLET | Freq: Every day | ORAL | Status: DC
  Administered 2013-03-26 – 2013-03-28 (×3): 10 mg via ORAL
  Filled 2013-03-25: qty 1
  Filled 2013-03-25: qty 3
  Filled 2013-03-25 (×2): qty 1
  Filled 2013-03-25: qty 3
  Filled 2013-03-25 (×3): qty 1

## 2013-03-25 MED ORDER — CHLORDIAZEPOXIDE HCL 25 MG PO CAPS
25.0000 mg | ORAL_CAPSULE | Freq: Once | ORAL | Status: AC
  Administered 2013-03-25: 25 mg via ORAL

## 2013-03-25 NOTE — Progress Notes (Signed)
[facility] Group Notes:  (Nursing/MHT/Case Management/Adjunct)  Date:  03/24/2013 Time:  2000  Type of Therapy:  Psychoeducational Skills  Participation Level:  Active  Participation Quality:  Attentive  Affect:  Depressed  Cognitive:  Appropriate  Insight:  Limited  Engagement in Group:  Limited  Modes of Intervention:  Education  Summary of Progress/Problems: The patient described his day as having felt "lost". He would not expound except to say that he is trying to take things one step at a time. His goal for tomorrow is to be more productive.   Eric Livingston S 03/25/2013, 12:54 AM

## 2013-03-25 NOTE — Progress Notes (Signed)
Patient ID: Eric Livingston, male   DOB: 14-Feb-1976, 37 y.o.   MRN: [id number] D)  Has been out on the hall this evening, has made and received several phone calls, attended group, but feeling somewhat anxious this evening.  Came to med window asking if he had any medications to help his anxiety and also c/o pain in lower back.  Was upset that he didn't have something besides motrin, but agreed to take it and use heat packs.  Reported it as 7, and no change after reassessment. Was given hs librium, stayed in dayroom most of evening to watch tv. A)  Will continue to monitor for safty, continue POC R)  Safety maintained.

## 2013-03-25 NOTE — [facility] Group Notes (Signed)
[facility] Group Notes:  (Clinical Social Work)  03/25/2013   3:00-4:00PM  Summary of Progress/Problems:   The main focus of today's process group was to   identify the patient's current support system and decide on other supports that can be put in place.  The picture on workbook was used to discuss why additional supports are needed, and a hand-out was distributed with four definitions/levels of support, then used to talk about how patients have given and received all different kinds of support.  An emphasis was placed on using counselor, doctor, therapy groups, 12-step groups, and problem-specific support groups to expand supports.  The patient stated that since he moved to this area several months ago nobody has helped him with anything.  He stated he will go about the process of building supports as he leaves the hospital just as he did when he first got here.  He remained angry about [facility] not being more helpful to him, not calling to see if he is in the hospital "or even alive."  Type of Therapy:  Process Group  Participation Level:  Active  Participation Quality:  Intrusive and Resistant  Affect:  Flat and Irritable  Cognitive:  Oriented  Insight:  Developing/Improving  Engagement in Therapy:  Limited  Modes of Intervention:  Education,  Support and Processing  Ambrose Mantle, LCSW 03/25/2013, 4:41 PM
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
## 2013-03-25 NOTE — Progress Notes (Signed)
Pt attended group 

## 2013-03-25 NOTE — Progress Notes (Signed)
D) Pt had not attended groups today. States that he has a headache and wanted to stay in the dark. Also did not sleep well last night due to his back pain. Has gone to meals. Still does not know where he will go when he leaves here, just knows that he will not go back to his girlfriend and the house. States "I am giving her the key back". Affect is flat and mood depressed. Rates his depression and his hopelessness both at a 1. Admits to thoughts of SI on and off. A) Given support and reassurance along with praise. Encouraged to talk with the provider concerning his blood pressure. R) Contracts for his safety on the unit.

## 2013-03-25 NOTE — Progress Notes (Signed)
[facility] MD Progress Note  03/25/2013 1:54 PM Eric Livingston  MRN:  [id number] Subjective:  "I feel groggy, my back hurts, and my blood pressure was sky high this morning." "My head hurts." States his depression is at an 8/10, with suicidal ideation that comes and goes. He says his anxiety is even worse and rates this as a 9/10. Diagnosis:  Alcohol dependence  ADL's:  Intact  Sleep: Poor  Appetite:  Fair  Suicidal Ideation:  Comes and goes, no plan, no intent Homicidal Ideation:  denies AEB (as evidenced by):  Psychiatric Specialty Exam: Review of Systems  Constitutional: Positive for chills. Diaphoresis: last night.  HENT: Negative for neck pain.   Eyes: Positive for blurred vision.  Respiratory: Positive for shortness of breath.   Cardiovascular: Negative.   Gastrointestinal: Positive for nausea and diarrhea. Negative for vomiting, constipation and blood in stool.  Genitourinary: Negative for dysuria.  Musculoskeletal: Positive for back pain.  Skin: Negative.   Neurological: Positive for dizziness, tremors and headaches.  Endo/Heme/Allergies: Negative.   Psychiatric/Behavioral: Positive for depression and suicidal ideas.    Blood pressure 186/133, pulse 81, temperature 97 F (36.1 C), temperature source Oral, resp. rate 18, height 6\' 3"  (1.905 m), weight 117.935 kg (260 lb).Body mass index is 32.5 kg/(m^2).  General Appearance: Casual  Eye Contact::  Fair  Speech:  Clear and Coherent  Volume:  Normal  Mood:  Depressed  Affect:  Congruent  Thought Process:  Goal Directed  Orientation:  Full (Time, Place, and Person)  Thought Content:  WDL  Suicidal Thoughts:  Yes.  without intent/plan  Homicidal Thoughts:  No  Memory:  Immediate;   Fair  Judgement:  Impaired  Insight:  Lacking  Psychomotor Activity:  Decreased  Concentration:  Fair  Recall:  Fair  Akathisia:  No  Handed:  Right  AIMS (if indicated):     Assets:  Communication Skills Desire for Improvement  Sleep:   Number of Hours: 6.75   Current Medications: Current Facility-Administered Medications  Medication Dose Route Frequency Provider Last Rate Last Dose  . chlordiazePOXIDE (LIBRIUM) capsule 25 mg  25 mg Oral Q6H PRN Shuvon Rankin, NP   25 mg at 03/25/13 6295  . chlordiazePOXIDE (LIBRIUM) capsule 25 mg  25 mg Oral Once Shuvon Rankin, NP      . chlordiazePOXIDE (LIBRIUM) capsule 25 mg  25 mg Oral QID Shuvon Rankin, NP   25 mg at 03/25/13 1312   Followed by  . [START ON 03/26/2013] chlordiazePOXIDE (LIBRIUM) capsule 25 mg  25 mg Oral TID Shuvon Rankin, NP       Followed by  . [START ON 03/27/2013] chlordiazePOXIDE (LIBRIUM) capsule 25 mg  25 mg Oral BH-qamhs Shuvon Rankin, NP       Followed by  . [START ON 03/28/2013] chlordiazePOXIDE (LIBRIUM) capsule 25 mg  25 mg Oral Daily Shuvon Rankin, NP      . divalproex (DEPAKOTE ER) 24 hr tablet 500 mg  500 mg Oral QHS Rachael Fee, MD   500 mg at 03/24/13 2123  . hydrOXYzine (ATARAX/VISTARIL) tablet 25 mg  25 mg Oral Q6H PRN Shuvon Rankin, NP   25 mg at 03/25/13 2841  . ibuprofen (ADVIL,MOTRIN) tablet 600 mg  600 mg Oral Q6H PRN Shuvon Rankin, NP   600 mg at 03/25/13 3244  . loperamide (IMODIUM) capsule 2-4 mg  2-4 mg Oral PRN Shuvon Rankin, NP      . multivitamin with minerals tablet 1 tablet  1 tablet Oral Daily  Shuvon Rankin, NP   1 tablet at 03/25/13 0834  . ondansetron (ZOFRAN-ODT) disintegrating tablet 4 mg  4 mg Oral Q6H PRN Shuvon Rankin, NP      . QUEtiapine (SEROQUEL) tablet 100 mg  100 mg Oral QHS Rachael Fee, MD   100 mg at 03/24/13 2121  . thiamine (VITAMIN B-1) tablet 100 mg  100 mg Oral Daily Shuvon Rankin, NP   100 mg at 03/25/13 1610    Lab Results:  Results for orders placed during the hospital encounter of 03/23/13 (from the past 48 hour(s))  ACETAMINOPHEN LEVEL     Status: None   Collection Time    03/23/13  8:50 PM      Result Value Range   Acetaminophen (Tylenol), Serum <15.0  10 - 30 ug/mL   Comment:            THERAPEUTIC  CONCENTRATIONS VARY     SIGNIFICANTLY. A RANGE OF 10-30     ug/mL MAY BE AN EFFECTIVE     CONCENTRATION FOR MANY PATIENTS.     HOWEVER, SOME ARE BEST TREATED     AT CONCENTRATIONS OUTSIDE THIS     RANGE.     ACETAMINOPHEN CONCENTRATIONS     >150 ug/mL AT 4 HOURS AFTER     INGESTION AND >50 ug/mL AT 12     HOURS AFTER INGESTION ARE     OFTEN ASSOCIATED WITH TOXIC     REACTIONS.  CBC     Status: None   Collection Time    03/23/13  8:50 PM      Result Value Range   WBC 5.2  4.0 - 10.5 K/uL   RBC 5.02  4.22 - 5.81 MIL/uL   Hemoglobin 15.5  13.0 - 17.0 g/dL   HCT 96.0  45.4 - 09.8 %   MCV 90.4  78.0 - 100.0 fL   MCH 30.9  26.0 - 34.0 pg   MCHC 34.1  30.0 - 36.0 g/dL   RDW 11.9  14.7 - 82.9 %   Platelets 270  150 - 400 K/uL  COMPREHENSIVE METABOLIC PANEL     Status: Abnormal   Collection Time    03/23/13  8:50 PM      Result Value Range   Sodium 142  135 - 145 mEq/L   Potassium 3.8  3.5 - 5.1 mEq/L   Chloride 104  96 - 112 mEq/L   CO2 27  19 - 32 mEq/L   Glucose, Bld 91  70 - 99 mg/dL   BUN 10  6 - 23 mg/dL   Creatinine, Ser 5.62 (*) 0.50 - 1.35 mg/dL   Calcium 8.7  8.4 - 13.0 mg/dL   Total Protein 6.4  6.0 - 8.3 g/dL   Albumin 3.3 (*) 3.5 - 5.2 g/dL   AST 25  0 - 37 U/L   ALT 30  0 - 53 U/L   Alkaline Phosphatase 47  39 - 117 U/L   Total Bilirubin 0.3  0.3 - 1.2 mg/dL   GFR calc non Af Amer 61 (*) >90 mL/min   GFR calc Af Amer 71 (*) >90 mL/min   Comment:            The eGFR has been calculated     using the CKD EPI equation.     This calculation has not been     validated in all clinical     situations.     eGFR's persistently     <90  mL/min signify     possible Chronic Kidney Disease.  ETHANOL     Status: Abnormal   Collection Time    03/23/13  8:50 PM      Result Value Range   Alcohol, Ethyl (B) 127 (*) 0 - 11 mg/dL   Comment:            LOWEST DETECTABLE LIMIT FOR     SERUM ALCOHOL IS 11 mg/dL     FOR MEDICAL PURPOSES ONLY  SALICYLATE LEVEL     Status:  Abnormal   Collection Time    03/23/13  8:50 PM      Result Value Range   Salicylate Lvl <2.0 (*) 2.8 - 20.0 mg/dL  URINE RAPID DRUG SCREEN (HOSP PERFORMED)     Status: None   Collection Time    03/23/13  9:41 PM      Result Value Range   Opiates NONE DETECTED  NONE DETECTED   Cocaine NONE DETECTED  NONE DETECTED   Benzodiazepines NONE DETECTED  NONE DETECTED   Amphetamines NONE DETECTED  NONE DETECTED   Tetrahydrocannabinol NONE DETECTED  NONE DETECTED   Barbiturates NONE DETECTED  NONE DETECTED   Comment:            DRUG SCREEN FOR MEDICAL PURPOSES     ONLY.  IF CONFIRMATION IS NEEDED     FOR ANY PURPOSE, NOTIFY LAB     WITHIN 5 DAYS.                LOWEST DETECTABLE LIMITS     FOR URINE DRUG SCREEN     Drug Class       Cutoff (ng/mL)     Amphetamine      1000     Barbiturate      200     Benzodiazepine   200     Tricyclics       300     Opiates          300     Cocaine          300     THC              50    Physical Findings: AIMS: Facial and Oral Movements Muscles of Facial Expression: None, normal Lips and Perioral Area: None, normal Jaw: None, normal Tongue: None, normal,Extremity Movements Upper (arms, wrists, hands, fingers): None, normal Lower (legs, knees, ankles, toes): None, normal, Trunk Movements Neck, shoulders, hips: None, normal, Overall Severity Severity of abnormal movements (highest score from questions above): None, normal Incapacitation due to abnormal movements: None, normal Patient's awareness of abnormal movements (rate only patient's report): No Awareness, Dental Status Current problems with teeth and/or dentures?: No Does patient usually wear dentures?: No  CIWA:  CIWA-Ar Total: 5 COWS:     Treatment Plan Summary: Daily contact with patient to assess and evaluate symptoms and progress in treatment Medication management  Plan: 1. Reviewed patient's records and will treat with lisinopril 10mg  po now as he is significantly hypertensive  with no medication for this. 2. Will also add a Librium 25mg  due to his current level of withdrawal with headache, tremors, nausea, irritability.  Medical Decision Making Problem Points:  Established problem, worsening (2) and New problem, with no additional work-up planned (3) Data Points:  Review or order clinical lab tests (1) Review or order medicine tests (1)   I certify that inpatient services furnished can reasonably be expected to improve the patient's condition.  Rona Ravens. Zeppelin Beckstrand RPAC 2:57 PM 03/25/2013 This patient was given Propranolol 10mg  instead of Lisinopril 10mg . So will hold the Lisinopril and recheck BP in 1 hour to evaluate.  RN will monitor and make this provider aware of any changes that are worrisome.  It is likely he will still need the lisinopril.

## 2013-03-25 NOTE — Progress Notes (Signed)
Adult Psychoeducational Group Note  Date:  03/25/2013 Time:  1:30PM  Group Topic/Focus:  Making Healthy Choices:   The focus of this group is to help patients identify negative/unhealthy choices they were using prior to admission and identify positive/healthier coping strategies to replace them upon discharge.  Participation Level:  Active  Participation Quality:  Appropriate  Affect:  Appropriate and Flat  Cognitive:  Appropriate  Insight: Appropriate  Engagement in Group:  Engaged  Modes of Intervention:  Discussion  Additional Comments:  Pt was active throughout group   Cidney Kirkwood K 03/25/2013, 2:55 PM

## 2013-03-25 NOTE — Progress Notes (Signed)
Psychoeducational Group Note  Date:  03/25/2013 Time:  1015  Group Topic/Focus:  Making Healthy Choices:   The focus of this group is to help patients identify negative/unhealthy choices they were using prior to admission and identify positive/healthier coping strategies to replace them upon discharge.  Participation Level:  Did Not Attend  Leeam Cedrone A 03/25/2013  

## 2013-03-25 NOTE — Progress Notes (Signed)
Psychoeducational Group Note  Date: 03/25/2013 Time:  03/25/2013  Group Topic/Focus:  Gratefulness:  The focus of this group is to help patients identify what two things they are most grateful for in their lives. What helps ground them and to center them on their work to their recovery.  Participation Level:  Did Not Attend  Lorrin Nawrot A   

## 2013-03-26 NOTE — Tx Team (Signed)
Interdisciplinary Treatment Plan Update   Date Reviewed:  03/26/2013  Time Reviewed:  9:39 AM  Progress in Treatment:   Attending groups: Yes Participating in groups: Yes Taking medication as prescribed: Yes  Tolerating medication: Yes Family/Significant other contact made: No, but will ask patient for consent for collateral contact Patient understands diagnosis: Yes  Discussing patient identified problems/goals with staff: Yes Medical problems stabilized or resolved: Yes Denies suicidal/homicidal ideation: Yes Patient has not harmed self or others: Yes  For review of initial/current patient goals, please see plan of care.  Estimated Length of Stay:  1-2 days  Reasons for Continued Hospitalization:  Anxiety Depression Medication stabilization  New Problems/Goals identified:    Discharge Plan or Barriers:   Home with outpatient follow up   Comments: Eric Livingston is a 37 y.o. male who presents to [facility] with SA/Depression/SI. Pt denies HI/Psych. Pt reports the following: pt states he relapsed on alcohol and THC, 2 wks ago. Pt consumes 6-40's and 2-1/5's daily for the last 2wks and states he's been sober for 8 mos, also using 2 blunts, daily, last use for both substances was 03/23/13. Pt says yesterday and this morning, he'd been feeling SI, no plan, but told this writer that he has been drinking an excessive amount of alcohol. Pt says precip events for relapse on alcohol: (1) death in the family--aunt died; (2) other family related problems; (3) stress in the home, states too many people living in his home. Pt denies and current SI, no plan or intent to harm self. Pt c/o w/d sxs: nausea, "shakes", sweats, headache, sensitive to light.   Attendee:  Patient:  03/26/2013 9:39 AM   Signature: Mervyn Gay, MD 03/26/2013 9:39 AM  Signature:  Verne Spurr, PA 03/26/2013 9:39 AM  Signature: 03/26/2013 9:39 AM  Signature:Beverly Terrilee Croak, RN 03/26/2013 9:39 AM  Signature:  Neill Loft RN  03/26/2013 9:39 AM  Signature:  Juline Patch, LCSW 03/26/2013 9:39 AM  Signature:  Reyes Ivan, LCSW 03/26/2013 9:39 AM  Signature:  Maseta Dorley,Care Coordinator 03/26/2013 9:39 AM  Signature: 03/26/2013 9:39 AM  Signature:    Signature:    Signature:      Scribe for Treatment Team:   Juline Patch,  03/26/2013 9:39 AM

## 2013-03-26 NOTE — B[facility] Group Notes (Addendum)
[facility] LCSW Aftercare Discharge Planning Group Note   03/26/2013 1:08 PM    Participation Quality:  Appropraite  Mood/Affect:  Appropriate  Depression Rating:  6  Anxiety Rating:  6  Thoughts of Suicide:  No  Will you contract for safety?   NA  Current AVH:  No  Plan for Discharge/Comments:  Patient attending discharge planning group and actively participated in group.  Patient advised of being followed by [facility] health for outpatient services.  CSW provided all participants with daily workbook.   Transportation Means: Patient has transportation.   Supports:  Patient has a support system.   Joeline Freer, Joesph July

## 2013-03-26 NOTE — Progress Notes (Signed)
Patient ID: Eric Livingston, male   DOB: [birth year], 37 y.o.   MRN: [id number] D)  Hs been out and about this evening, states feeling better since his BP has come down, still feeling somewhat anxious r/t withdrawal and c/o diarrhea.  Was medicated.  Attended group, interacting appropriately with staff and peers, less anxious this evening, pleasant, good eye contact.  Still had some c/o back pain, no med needed at this time. A)  Will continue to monitor for safety, w/d sx, continue POC R)  Safety maintained.

## 2013-03-26 NOTE — Progress Notes (Signed)
[facility] Group Notes:  (Nursing/MHT/Case Management/Adjunct)  Date:  03/25/2013 Time:  2000  Type of Therapy:  Psychoeducational Skills  Participation Level:  Active  Participation Quality:  Appropriate  Affect:  Appropriate  Cognitive:  Appropriate  Insight:  Good  Engagement in Group:  Developing/Improving  Modes of Intervention:  Education  Summary of Progress/Problems: The patient shared with the group that he didn't feel well at times due to his blood pressure. On a more positive note, the patient did have a family visit today in which he received some clothing and money. His goal for tomorrow is to try to add some structure to his life.   Christiaan Strebeck S 03/26/2013, 1:51 AM

## 2013-03-26 NOTE — Progress Notes (Signed)
Adult Psychoeducational Group Note  Date:  03/26/2013 Time:  7:03 PM  Group Topic/Focus:  Self Care:   The focus of this group is to help patients understand the importance of self-care in order to improve or restore emotional, physical, spiritual, interpersonal, and financial health.  Participation Level:  Minimal  Participation Quality:  Attentive  Affect:  Appropriate  Cognitive:  Appropriate  Insight: Improving  Engagement in Group:  Improving  Modes of Intervention:  Activity, Discussion, Education and Socialization  Additional Comments:  Kyri attended group and participated and shared at times during group. Patient completed self assessment on the areas of wellness. Patient rated how well self care is in the areas of physical, spiritual, relationship, emotional and psychological care. Afterwards patient shared what were some weaknesses and strengths in the areas. Patient also was asked to set a goal on how improvement can be made in relation to self care.   Karleen Hampshire Brittini 03/26/2013, 7:03 PM

## 2013-03-26 NOTE — [facility] Group Notes (Signed)
[facility] LCSW Group Therapy          Overcoming Obstacles       1:15 -2:30        03/26/2013   1:10 PM     Type of Therapy:  Group Therapy  Participation Level:  Appropriate  Participation Quality:  Appropriate  Affect:  Appropriate, Alert  Cognitive:  Attentive Appropriate  Insight: Developing/Improving Engaged  Engagement in Therapy: Developing/Imprvoing Engaged  Modes of Intervention:  Discussion Exploration  Education Rapport BuildingProblem-Solving Support  Summary of Progress/Problems:  The main focus of today's group was overcoming  Obstacles.  He advised his obstacle is abandonment issues.  He agreed he needs to go into treatment for alcohol dependence but hesitates to go. He shared it is difficult for him to trust others.  Patient met with Clinical research associate following group and consented for referral to [facility] residential.  Juline Patch Hairston 03/26/2013    1:10 PM

## 2013-03-26 NOTE — Progress Notes (Signed)
Patient ID: Eric Livingston, male   DOB: 1976/08/11, 37 y.o.   MRN: [id number] [facility] MD Progress Note  03/26/2013 2:58 PM Eric Livingston  MRN:  [id number]  Subjective:  I'm feeling better today, very few w/d symptoms. Headache is much better. Sleep is good, appetite is better.  States his depression is a 6/10, and his anxiety is a 5/10.  States occasionally he will hear self recrimination voices in his head but denies command voices.  Diagnosis:  Alcohol dependence  ADL's:  Intact  Sleep: Poor  Appetite:  Fair  Suicidal Ideation:  Comes and goes, no plan, no intent Homicidal Ideation:  denies AEB (as evidenced by):  Psychiatric Specialty Exam: Review of Systems  Constitutional: Negative for fever, weight loss and malaise/fatigue. Diaphoresis: last night.  HENT: Negative for congestion, sore throat and neck pain.   Eyes: Negative for double vision and photophobia.  Respiratory: Negative for cough and wheezing.   Cardiovascular: Negative.  Negative for chest pain, palpitations and PND.  Gastrointestinal: Negative for heartburn, vomiting, abdominal pain, constipation and blood in stool.  Genitourinary: Negative for dysuria.  Musculoskeletal: Positive for back pain. Negative for myalgias, joint pain and falls.  Skin: Negative.   Neurological: Negative for tingling, sensory change, speech change, focal weakness, seizures, loss of consciousness and weakness.  Endo/Heme/Allergies: Negative.  Negative for polydipsia. Does not bruise/bleed easily.  Psychiatric/Behavioral: Positive for depression. Negative for hallucinations, memory loss and substance abuse. The patient is not nervous/anxious and does not have insomnia.   All other systems reviewed and are negative.    Blood pressure 137/98, pulse 65, temperature 97.5 F (36.4 C), temperature source Oral, resp. rate 16, height 6\' 3"  (1.905 m), weight 117.935 kg (260 lb), SpO2 99.00%.Body mass index is 32.5 kg/(m^2).  General Appearance:  Casual  Eye Contact::  Fair  Speech:  Clear and Coherent  Volume:  Normal  Mood:  Depressed  Affect:  Congruent  Thought Process:  Goal Directed  Orientation:  Full (Time, Place, and Person)  Thought Content:  WDL  Suicidal Thoughts:  Yes.  without intent/plan  Homicidal Thoughts:  No  Memory:  Immediate;   Fair  Judgement:  Impaired  Insight:  Lacking  Psychomotor Activity:  Decreased  Concentration:  Fair  Recall:  Fair  Akathisia:  No  Handed:  Right  AIMS (if indicated):     Assets:  Communication Skills Desire for Improvement  Sleep:  Number of Hours: 6.5   Current Medications: Current Facility-Administered Medications  Medication Dose Route Frequency Provider Last Rate Last Dose  . chlordiazePOXIDE (LIBRIUM) capsule 25 mg  25 mg Oral Q6H PRN Shuvon Rankin, NP   25 mg at 03/25/13 7829  . chlordiazePOXIDE (LIBRIUM) capsule 25 mg  25 mg Oral Once Shuvon Rankin, NP      . chlordiazePOXIDE (LIBRIUM) capsule 25 mg  25 mg Oral TID Shuvon Rankin, NP   25 mg at 03/26/13 1139   Followed by  . [START ON 03/27/2013] chlordiazePOXIDE (LIBRIUM) capsule 25 mg  25 mg Oral BH-qamhs Shuvon Rankin, NP       Followed by  . [START ON 03/28/2013] chlordiazePOXIDE (LIBRIUM) capsule 25 mg  25 mg Oral Daily Shuvon Rankin, NP      . divalproex (DEPAKOTE ER) 24 hr tablet 500 mg  500 mg Oral QHS Rachael Fee, MD   500 mg at 03/25/13 2202  . hydrOXYzine (ATARAX/VISTARIL) tablet 25 mg  25 mg Oral Q6H PRN Shuvon Rankin, NP   25 mg at  03/25/13 1610  . ibuprofen (ADVIL,MOTRIN) tablet 600 mg  600 mg Oral Q6H PRN Shuvon Rankin, NP   600 mg at 03/25/13 9604  . lisinopril (PRINIVIL,ZESTRIL) tablet 10 mg  10 mg Oral Daily Verne Spurr, PA-C   10 mg at 03/26/13 5409  . loperamide (IMODIUM) capsule 2-4 mg  2-4 mg Oral PRN Shuvon Rankin, NP   4 mg at 03/25/13 2203  . multivitamin with minerals tablet 1 tablet  1 tablet Oral Daily Shuvon Rankin, NP   1 tablet at 03/26/13 0821  . ondansetron (ZOFRAN-ODT)  disintegrating tablet 4 mg  4 mg Oral Q6H PRN Shuvon Rankin, NP      . QUEtiapine (SEROQUEL) tablet 100 mg  100 mg Oral QHS Rachael Fee, MD   100 mg at 03/25/13 2203  . thiamine (VITAMIN B-1) tablet 100 mg  100 mg Oral Daily Shuvon Rankin, NP   100 mg at 03/26/13 8119    Lab Results:  No results found for this or any previous visit (from the past 48 hour(s)).  Physical Findings: AIMS: Facial and Oral Movements Muscles of Facial Expression: None, normal Lips and Perioral Area: None, normal Jaw: None, normal Tongue: None, normal,Extremity Movements Upper (arms, wrists, hands, fingers): None, normal Lower (legs, knees, ankles, toes): None, normal, Trunk Movements Neck, shoulders, hips: None, normal, Overall Severity Severity of abnormal movements (highest score from questions above): None, normal Incapacitation due to abnormal movements: None, normal Patient's awareness of abnormal movements (rate only patient's report): No Awareness, Dental Status Current problems with teeth and/or dentures?: No Does patient usually wear dentures?: No  CIWA:  CIWA-Ar Total: 3 COWS:     Treatment Plan Summary: Daily contact with patient to assess and evaluate symptoms and progress in treatment Medication management  Plan: 1. Will continue to monitor BP and recommend staying on Lisinopril for hypertension. 2. Will continue current plan of care with no changes at this time with a goal of discharge 1-2 days. 3. He will meet with the CM to discuss his options for follow up care upon discharge. ELOS: 1-2 days.   Medical Decision Making Problem Points:  Established problem, worsening (2) and New problem, with no additional work-up planned (3) Data Points:  Review or order clinical lab tests (1) Review or order medicine tests (1)   I certify that inpatient services furnished can reasonably be expected to improve the patient's condition.  Rona Ravens. Mashburn RPAC 2:58 PM 03/26/2013  Reviewed the  information documented and agree with the treatment plan.  Lanika Colgate,JANARDHAHA R. 03/26/2013 4:06 PM

## 2013-03-26 NOTE — Progress Notes (Signed)
Patient ID: Eric Livingston, male   DOB: [birth year], 37 y.o.   MRN: [id number] D-Patient reports fair sleep last night.  His appetite is improving.  He rates his depression and hopelessness at 6/10.  He is having some tremors and some anxiety but otherwise no complaints of withdrawal at this time.  A- Supported patient.  Encouraged him to attend all groups and R- He is doing so.

## 2013-03-26 NOTE — Progress Notes (Signed)
Recreation Therapy Notes   Date: 08.11.2014 Time: 3:00pm Location: 500 Hall Dayroom  Group Topic: Stress Management  Goal Area(s) Addresses:  Patient will verbalize importance of using healthy stress management.  Patient will identify stress management technique of choice.  Behavioral Response: Disengaged, Irritable  Intervention: Visualization  Activity: Activity: Guided Imagery. Patients listened to recorded Guided Imagery script about a day at the beach. Patients received stress ball at the conclusion of group session. Patients were instructed on rules of having a stress ball and verbalized understanding on rules.  Education:  Pharmacologist, Discharge Planing  Education Outcome: Acknowledges understanding  Clinical Observations/Feedback: Patient attended session, but did not engage. Patient sat with hand covering eyes, when patient did remove hand from over eyes he could be seen rolling eyes at LRT or peer statements. At approximately 3:15pm patient asked to be excused from session to use the restroom. Patient did not return to group session.   Marykay Lex Milliana Reddoch, LRT/CTRS  Jearl Klinefelter 03/26/2013 4:31 PM
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
## 2013-03-27 DIAGNOSIS — F1020 Alcohol dependence, uncomplicated: Principal | ICD-10-CM

## 2013-03-27 NOTE — Progress Notes (Signed)
Pt observed in the dayroom talking with peers and watching TV.  Pt reports he is doing well.  He denies any withdrawal symptoms at this time.  He denies SI/HI/AV.  Pt reports he wants to go to long term treatment for his alcohol addiction.  He will probably stay with a friend until he can get into [facility].  Pt makes his needs known to staff.  Support and encouragement offered.  Safety maintained with q15 minutes.

## 2013-03-27 NOTE — Progress Notes (Signed)
The focus of this group is to educate the patient on the purpose and policies of crisis stabilization and provide a format to answer questions about their admission.  The group details unit policies and expectations of patients while admitted.  Patient attended 0900 nurse education orientation group.  Patient actively participated, appropriate affect, alert, appropriate insight and engagement.  Patient will work on discharge goals today.  

## 2013-03-27 NOTE — Progress Notes (Signed)
Adult Psychoeducational Group Note  Date:  03/27/2013 Time:  12:56 PM  Group Topic/Focus:  Recovery Goals:   The focus of this group is to identify appropriate goals for recovery and establish a plan to achieve them.  Participation Level:  Active  Participation Quality:  Appropriate  Affect:  Anxious and Appropriate  Cognitive:  Appropriate  Insight: Appropriate and Good  Engagement in Group:  Engaged  Modes of Intervention:  Discussion  Additional Comments:  Pt stated that recovery to him was to stay clean, positive and productive, go forward and get a sponsor. He stated that staying around positive ppl and taking it one day at a time is what will help in his recovery.   [name] 03/27/2013, 12:56 PM

## 2013-03-27 NOTE — [facility] Group Notes (Signed)
[facility] LCSW Group Therapy      Feelings About Diagnosis 1:15 - 2:30 PM         03/27/2013     Type of Therapy:  Group Therapy  Participation Level:  Active  Participation Quality:  Appropriate  Affect:  Appropriate  Cognitive:  Alert and Appropriate  Insight:  Developing/Improving and Engaged  Engagement in Therapy:  Developing/Improving and Engaged  Modes of Intervention:  Discussion, Education, Exploration, Problem-Solving, Rapport Building, Support  Summary of Progress/Problems:  Patient actively participated in group. Patient discussed past and present diagnosis and the effects it has had on  life.  Patient talked about Bipolar Disorder being a family illness the stigma associated with having a mental health diagnosis.  Patient shared he does not plan to use his diagnosis as an excuse to do nothing with his life.  Wynn Banker 03/27/2013

## 2013-03-27 NOTE — Progress Notes (Signed)
Patient ID: Eric Livingston, male   DOB: 02-12-1976, 37 y.o.   MRN: [id number] [facility] MD Progress Note  03/27/2013 3:49 PM Eric Livingston  MRN:  [id number]  Subjective: Patient is up and active on the unit milieu. He is motivated and looking forward to going into treatment for his alcoholism. He hopes to be placed at [facility] and is worried that he will relapse if he doesn't go to [facility] directly from [facility].  Diagnosis:  Alcohol dependence  ADL's:  Intact  Sleep: Poor  Appetite:  Fair  Suicidal Ideation:  Comes and goes, no plan, no intent Homicidal Ideation:  denies AEB (as evidenced by):  Psychiatric Specialty Exam: Review of Systems  Constitutional: Negative for fever, weight loss and malaise/fatigue. Diaphoresis: last night.  HENT: Negative for congestion, sore throat and neck pain.   Eyes: Negative for double vision and photophobia.  Respiratory: Negative for cough and wheezing.   Cardiovascular: Negative.  Negative for chest pain, palpitations and PND.  Gastrointestinal: Negative for heartburn, vomiting, abdominal pain, constipation and blood in stool.  Genitourinary: Negative for dysuria.  Musculoskeletal: Positive for back pain. Negative for myalgias, joint pain and falls.  Skin: Negative.   Neurological: Negative for tingling, sensory change, speech change, focal weakness, seizures, loss of consciousness and weakness.  Endo/Heme/Allergies: Negative.  Negative for polydipsia. Does not bruise/bleed easily.  Psychiatric/Behavioral: Positive for depression. Negative for hallucinations, memory loss and substance abuse. The patient is not nervous/anxious and does not have insomnia.   All other systems reviewed and are negative.    Blood pressure 133/101, pulse 76, temperature 98 F (36.7 C), temperature source Oral, resp. rate 16, height 6\' 3"  (1.905 m), weight 117.935 kg (260 lb), SpO2 99.00%.Body mass index is 32.5 kg/(m^2).  General Appearance: Casual  Eye Contact::   Fair  Speech:  Clear and Coherent  Volume:  Normal  Mood:  Depressed  Affect:  Congruent  Thought Process:  Goal Directed  Orientation:  Full (Time, Place, and Person)  Thought Content:  WDL  Suicidal Thoughts:  Yes.  without intent/plan  Homicidal Thoughts:  No  Memory:  Immediate;   Fair  Judgement:  Impaired  Insight:  Lacking  Psychomotor Activity:  Decreased  Concentration:  Fair  Recall:  Fair  Akathisia:  No  Handed:  Right  AIMS (if indicated):     Assets:  Communication Skills Desire for Improvement  Sleep:  Number of Hours: 6.75   Current Medications: Current Facility-Administered Medications  Medication Dose Route Frequency Provider Last Rate Last Dose  . chlordiazePOXIDE (LIBRIUM) capsule 25 mg  25 mg Oral Once Shuvon Rankin, NP      . chlordiazePOXIDE (LIBRIUM) capsule 25 mg  25 mg Oral BH-qamhs Shuvon Rankin, NP   25 mg at 03/27/13 0747   Followed by  . [START ON 03/28/2013] chlordiazePOXIDE (LIBRIUM) capsule 25 mg  25 mg Oral Daily Shuvon Rankin, NP      . divalproex (DEPAKOTE ER) 24 hr tablet 500 mg  500 mg Oral QHS Rachael Fee, MD   500 mg at 03/26/13 2208  . ibuprofen (ADVIL,MOTRIN) tablet 600 mg  600 mg Oral Q6H PRN Shuvon Rankin, NP   600 mg at 03/25/13 7829  . lisinopril (PRINIVIL,ZESTRIL) tablet 10 mg  10 mg Oral Daily Verne Spurr, PA-C   10 mg at 03/27/13 0747  . multivitamin with minerals tablet 1 tablet  1 tablet Oral Daily Shuvon Rankin, NP   1 tablet at 03/27/13 0747  . QUEtiapine (SEROQUEL)  tablet 100 mg  100 mg Oral QHS Rachael Fee, MD   100 mg at 03/26/13 2208  . thiamine (VITAMIN B-1) tablet 100 mg  100 mg Oral Daily Shuvon Rankin, NP   100 mg at 03/27/13 1610    Lab Results:  No results found for this or any previous visit (from the past 48 hour(s)).  Physical Findings: AIMS: Facial and Oral Movements Muscles of Facial Expression: None, normal Lips and Perioral Area: None, normal Jaw: None, normal Tongue: None, normal,Extremity  Movements Upper (arms, wrists, hands, fingers): None, normal Lower (legs, knees, ankles, toes): None, normal, Trunk Movements Neck, shoulders, hips: None, normal, Overall Severity Severity of abnormal movements (highest score from questions above): None, normal Incapacitation due to abnormal movements: None, normal Patient's awareness of abnormal movements (rate only patient's report): No Awareness, Dental Status Current problems with teeth and/or dentures?: No Does patient usually wear dentures?: No  CIWA:  CIWA-Ar Total: 0 COWS:     Treatment Plan Summary: Daily contact with patient to assess and evaluate symptoms and progress in treatment Medication management  Plan: 1. Will continue to monitor BP and recommend staying on Lisinopril for hypertension. 2. Will continue current plan of care with no changes at this time with a goal of discharge  3. Encouraged the patient to be proactive in finding housing until he can get into [facility] by calling shelters himself.  4. Praised the patient for his motivation and offered support. ELOS: D/C in AM.  Medical Decision Making Problem Points:  Established problem, worsening (2) and New problem, with no additional work-up planned (3) Data Points:  Review or order clinical lab tests (1) Review or order medicine tests (1)   I certify that inpatient services furnished can reasonably be expected to improve the patient's condition.  Rona Ravens. Mashburn RPAC 3:49 PM 03/27/2013  Patient was evaluated, developed treatment and case discussed with treatment team. Reviewed the information documented and agree with the treatment plan.  Nehemiah Settle., M.D. 03/30/2013 8:37 PM

## 2013-03-27 NOTE — Progress Notes (Signed)
Agree with assessment and plan Kharter Sestak A. Caton Popowski, M.D. 

## 2013-03-27 NOTE — Progress Notes (Signed)
Recreation Therapy Notes  Date: 08.12.2014 Time: 2:45pm Location: 500 Hall Dayroom  Group Topic: Animal Assisted Activities  Goal Area(s) Addresses:  Patient will interact appropriately with dog team.    Behavioral Response: Engaged, Appropriate  Intervention: Animal Assisted Therapy Dog Team.   Clinical Observations/Feedback: Dog Team: [employer]. Patient interacted appropriately with peer, dog team, LRT and MHT.   Marykay Lex Jong Rickman, LRT/CTRS  Jearl Klinefelter 03/27/2013 4:54 PM

## 2013-03-27 NOTE — Progress Notes (Addendum)
D:  Patient's self inventory sheet, patient has fair sleep, improving appetite, normal energy level, good attention span.  Has experienced tremors.  Denied SI.  Has experienced lightheaded in past 24 hours.  Worst pain #1.  After discharge, plans to take her meds.  No questions for staff.  No discharge plans.  No problems taking meds after discharge. A:  Medications administered per MD orders.  Emotional support and encouragement given patient. R:  Denied SI and HI.  Denied A/V hallucinations.  Denied pain.  Will continue to monitor for safety with 15 minute checks.  Safety maintained.

## 2013-03-27 NOTE — [facility] Suicide Risk Assessment (Signed)
[facility] INPATIENT:  Family/Significant Other Suicide Prevention Education  Suicide Prevention Education:  Education Completed; Berenice Bouton, Friend, [phone]; has been identified by the patient as the family member/significant other with whom the patient will be residing, and identified as the person(s) who will aid the patient in the event of a mental health crisis (suicidal ideations/suicide attempt).  With written consent from the patient, the family member/significant other has been provided the following suicide prevention education, prior to the and/or following the discharge of the patient.  The suicide prevention education provided includes the following:  Suicide risk factors  Suicide prevention and interventions  National Suicide Hotline telephone number  [facility] assessment telephone number  [facility] Emergency Assistance 911  [facility] and/or Residential Mobile Crisis Unit telephone number  Request made of family/significant other to:  Remove weapons (e.g., guns, rifles, knives), all items previously/currently identified as safety concern. Friend advised patient does not have access to a gun.  Remove drugs/medications (over-the-counter, prescriptions, illicit drugs), all items previously/currently identified as a safety concern.  The family member/significant other verbalizes understanding of the suicide prevention education information provided.  The family member/significant other agrees to remove the items of safety concern listed above.  Trayonna Bachmeier Hairston 03/27/2013, 1:10 PM

## 2013-03-28 DIAGNOSIS — F1910 Other psychoactive substance abuse, uncomplicated: Secondary | ICD-10-CM

## 2013-03-28 MED ORDER — DIVALPROEX SODIUM ER 500 MG PO TB24: 500.0000 mg | ORAL_TABLET | Freq: Every day | ORAL | Status: DC

## 2013-03-28 MED ORDER — QUETIAPINE FUMARATE 100 MG PO TABS: 100.0000 mg | ORAL_TABLET | Freq: Every day | ORAL | Status: DC

## 2013-03-28 MED ORDER — LISINOPRIL 10 MG PO TABS: 10.0000 mg | ORAL_TABLET | Freq: Every day | ORAL | Status: DC

## 2013-03-28 NOTE — Progress Notes (Signed)
Discharge Note: Discharge instructions/prescriptions/medication samples given to patient. Patient verbalized understanding of discharge instructions and prescriptions. Returned belongings to patient. Denies SI/HI/AVH. Other RN staff, d/c patient without incident to the lobby.

## 2013-03-28 NOTE — Progress Notes (Signed)
[facility] Adult Case Management Discharge Plan :  Will you be returning to the same living situation after discharge: Yes,  Patient returning to the home. At discharge, do you have transportation home?:Yes,  Patient has transportation home. Do you have the ability to pay for your medications:Yes,  Patient has Medicaid.  Release of information consent forms completed and in the chart;  Patient's signature needed at discharge.  Patient to Follow up at: Follow-up Information   Follow up with [facility] On 04/03/2013. (Please go to [facility] on Tuesday, April 03, 2013 at 8:00 AM for an admission assessment)    Contact information:   [address] [place], [place]   [address]  [phone]      Follow up with [facility] On 03/30/2013. (You are scheduled with Caffie Pinto at [facility] on Friday, March 30, 2013)    Contact information:   [address] [place], [place]   [address]  [phone]      Follow up with [facility] On 04/02/2013. (Please go to [facility]'s walk in clinic on Monday, April 02, 2013 or any weekday between 8AM-3PM for medication management)    Contact information:   [address] [place], [place]   [address]  [phone]      Patient denies SI/HI:   Patient no longer endorsing SI/HI or other thoughts of self harm.     Safety Planning and Suicide Prevention discussed:  Patient no longer endorsing SI/HI or other thoughts of self harm.  Wynn Banker 03/28/2013, 9:47 AM

## 2013-03-28 NOTE — [facility] Group Notes (Signed)
[facility] LCSW Aftercare Discharge Planning Group Note   03/28/2013 12:46 PM    Participation Quality:  Appropraite  Mood/Affect:  Appropriate  Depression Rating:  5  Anxiety Rating:  5  Thoughts of Suicide:  No  Will you contract for safety?   NA  Current AVH:  No  Plan for Discharge/Comments:  Patient attending discharge planning group and actively participated in group.  Patient advised he is looking forward to going to [facility] but concerned that he may relapse before 04/03/13.  Patient informed of being scheduled with [facility] on 8/15 and [facility] on 04/02/13.  He was encouraged to follow up with all appointments.  CSW provided all participants with daily workbook and information on services offered by [facility].   Transportation Means: Patient has transportation.   Supports:  Patient has a support system.   Brysyn Brandenberger, Joesph July

## 2013-03-28 NOTE — Discharge Summary (Signed)
Physician Discharge Summary Note  Patient:  Eric Livingston is an 37 y.o., male MRN:  [id number] DOB:  [birth year] Patient phone:  [phone] (home)  Patient address:   [address] [place] [address],   Date of Admission:  03/24/2013 Date of Discharge: 03/28/2013   Reason for Admission:  Alcohol dependence  Discharge Diagnoses: Principal Problem:   Alcohol dependence syndrome Active Problems:   Essential hypertension, benign  Review of Systems  Constitutional: Negative.  Negative for fever, chills, weight loss, malaise/fatigue and diaphoresis.  HENT: Negative for congestion and sore throat.   Eyes: Negative for blurred vision, double vision and photophobia.  Respiratory: Negative for cough, shortness of breath and wheezing.   Cardiovascular: Negative for chest pain, palpitations and PND.  Gastrointestinal: Negative for heartburn, nausea, vomiting, abdominal pain, diarrhea and constipation.  Musculoskeletal: Negative for myalgias, joint pain and falls.  Neurological: Negative for dizziness, tingling, tremors, sensory change, speech change, focal weakness, seizures, loss of consciousness, weakness and headaches.  Endo/Heme/Allergies: Negative for polydipsia. Does not bruise/bleed easily.  Psychiatric/Behavioral: Negative for depression, suicidal ideas, hallucinations, memory loss and substance abuse. The patient is not nervous/anxious and does not have insomnia.   Discharge Diagnoses:  AXIS I: Substance Induced Mood Disorder and Alcohol dependence and polysubstance abuse  AXIS II: Deferred  AXIS III:  Past Medical History   Diagnosis  Date   .  Hypertension    .  Alcohol abuse    .  Depression     AXIS IV: economic problems, occupational problems, other psychosocial or environmental problems, problems related to social environment and problems with primary support group  AXIS V: 51-60 moderate symptoms   Level of Care:  OP  Hospital Course:  Eric Livingston was admitted for  alcohol dependence and crisis management.  He was treated with the standard protocol for detox.  Medical problems were identified and treated.  Home medications was restarted as appropriate.  Improvement was monitored by CIWA scores and patient's daily report of withdrawal symptom reduction.  Emotional and mental status was monitored by daily self inventory reports completed by the patient and clinical staff.  The patient was evaluated by the treatment team for stability and plans for continued recovery upon discharge.  He was offered further treatment options upon discharge including Residential, Intensive Outpatient and Outpatient treatment.  The patient's motivation was an integral factor for scheduling further treatment.  Employment, transportation, bed availability, health status, family support, and any pending legal issues were also considered.  Upon completion of detox the patient was both and mentally and medically stable for discharge. He will report to [facility] on 04/04/2013.  Consults:  None  Significant Diagnostic Studies:  None  Discharge Vitals:   Blood pressure 135/93, pulse 85, temperature 97.3 F (36.3 C), temperature source Oral, resp. rate 20, height 6\' 3"  (1.905 m), weight 117.935 kg (260 lb), SpO2 99.00%. Body mass index is 32.5 kg/(m^2). Lab Results:   No results found for this or any previous visit (from the past 72 hour(s)).  Physical Findings: AIMS: Facial and Oral Movements Muscles of Facial Expression: None, normal Lips and Perioral Area: None, normal Jaw: None, normal Tongue: None, normal,Extremity Movements Upper (arms, wrists, hands, fingers): None, normal Lower (legs, knees, ankles, toes): None, normal, Trunk Movements Neck, shoulders, hips: None, normal, Overall Severity Severity of abnormal movements (highest score from questions above): None, normal Incapacitation due to abnormal movements: None, normal Patient's awareness of abnormal movements  (rate only patient's report): No Awareness, Dental  Status Current problems with teeth and/or dentures?: No Does patient usually wear dentures?: No  CIWA:  CIWA-Ar Total: 0 COWS:     Psychiatric Specialty Exam: See Psychiatric Specialty Exam and Suicide Risk Assessment completed by Attending Physician prior to discharge.  Discharge destination:  Home  Is patient on multiple antipsychotic therapies at discharge:  No   Has Patient had three or more failed trials of antipsychotic monotherapy by history:  No  Recommended Plan for Multiple Antipsychotic Therapies: NA  Discharge Orders   Future Orders Complete By Expires   Diet - low sodium heart healthy  As directed    Discharge instructions  As directed    Comments:     Take all of your medications as directed. Be sure to keep all of your follow up appointments.  If you are unable to keep your follow up appointment, call your Doctor's office to let them know, and reschedule.  Make sure that you have enough medication to last until your appointment. Be sure to get plenty of rest. Going to bed at the same time each night will help. Try to avoid sleeping during the day.  Increase your activity as tolerated. Regular exercise will help you to sleep better and improve your mental health. Eating a heart healthy diet is recommended. Try to avoid salty or fried foods. Be sure to avoid all alcohol and illegal drugs.   Increase activity slowly  As directed        Medication List    STOP taking these medications       diazepam 5 MG tablet  Commonly known as:  VALIUM     oxyCODONE-acetaminophen 5-325 MG per tablet  Commonly known as:  PERCOCET/ROXICET      TAKE these medications     Indication   divalproex 500 MG 24 hr tablet  Commonly known as:  DEPAKOTE ER  Take 1 tablet (500 mg total) by mouth at bedtime.   Indication:  Manic Phase of Manic-Depression, mood instability     lisinopril 10 MG tablet  Commonly known as:  PRINIVIL,ZESTRIL   Take 1 tablet (10 mg total) by mouth daily.   Indication:  High Blood Pressure     multivitamin with minerals Tabs tablet  Take 1 tablet by mouth daily.      QUEtiapine 100 MG tablet  Commonly known as:  SEROQUEL  Take 1 tablet (100 mg total) by mouth at bedtime.   Indication:  Depressive Phase of Manic-Depression, mood instability           Follow-up Information   Follow up with [facility] On 04/03/2013. (Please go to [facility] on Tuesday, April 03, 2013 at 8:00 AM for an admission assessment)    Contact information:   [address] [place], [place]   [address]  [phone]      Follow up with [facility] On 03/30/2013. (You are scheduled with Caffie Pinto at [facility] on Friday, March 30, 2013)    Contact information:   [address] [place], [place]   [address]  [phone]      Follow up with [facility] On 04/02/2013. (Please go to [facility]'s walk in clinic on Monday, April 02, 2013 or any weekday between 8AM-3PM for medication management)    Contact information:   [address] [place], [place]   [address]  [phone]      Follow-up recommendations:   Activities: Resume activity as tolerated. Diet: Heart healthy low sodium diet Tests: Follow up testing will  be determined by your out patient provider.  Comments:   Total Discharge Time:  Greater than 30 minutes.  Signed: Rona Ravens. [facility] 03/28/2013  Patient examined personally and developed treatment plan. Case discussed with treatment team. Reviewed the information documented and agree with the treatment plan.  Nelida Mandarino,JANARDHAHA R. 04/03/2013 4:29 PM

## 2013-03-28 NOTE — [facility] Suicide Risk Assessment (Signed)
Suicide Risk Assessment  Discharge Assessment     Demographic Factors:  Male, Low socioeconomic status and Unemployed  Mental Status Per Nursing Assessment::   On Admission:     Current Mental Status by Physician: Mental Status Examination: Patient appeared as per his stated age, casually dressed, and fairly groomed, and maintaining good eye contact. Patient has good mood and his affect was constricted. He has normal rate, rhythm, and volume of speech. His thought process is linear and goal directed. Patient has denied suicidal, homicidal ideations, intentions or plans. Patient has no evidence of auditory or visual hallucinations, delusions, and paranoia. Patient has fair insight judgment and impulse control.  Loss Factors: Decrease in vocational status and Financial problems/change in socioeconomic status  Historical Factors: Impulsivity  Risk Reduction Factors:   Sense of responsibility to family, Religious beliefs about death, Living with another person, especially a relative, Positive social support, Positive therapeutic relationship and Positive coping skills or problem solving skills  Continued Clinical Symptoms:  Alcohol/Substance Abuse/Dependencies  Cognitive Features That Contribute To Risk:  Closed-mindedness Polarized thinking    Suicide Risk:  Mild:  Suicidal ideation of limited frequency, intensity, duration, and specificity.  There are no identifiable plans, no associated intent, mild dysphoria and related symptoms, good self-control (both objective and subjective assessment), few other risk factors, and identifiable protective factors, including available and accessible social support.  Discharge Diagnoses:   AXIS I:  Substance Induced Mood Disorder and Alcohol dependence and polysubstance abuse AXIS II:  Deferred AXIS III:   Past Medical History  Diagnosis Date  . Hypertension   . Alcohol abuse   . Depression    AXIS IV:  economic problems, occupational  problems, other psychosocial or environmental problems, problems related to social environment and problems with primary support group AXIS V:  51-60 moderate symptoms  Plan Of Care/Follow-up recommendations:  Activity:  As tolerated Diet:  Regular  Is patient on multiple antipsychotic therapies at discharge:  No   Has Patient had three or more failed trials of antipsychotic monotherapy by history:  No  Recommended Plan for Multiple Antipsychotic Therapies: Not applicable  Eric Livingston., M.D. 03/28/2013, 12:30 PM

## 2013-03-28 NOTE — Tx Team (Signed)
Interdisciplinary Treatment Plan Update   Date Reviewed:  03/28/2013  Time Reviewed:  9:49 AM  Progress in Treatment:   Attending groups: Yes Participating in groups: Yes Taking medication as prescribed: Yes  Tolerating medication: Yes Family/Significant other contact made: Yes, contact made with girlfriend Patient understands diagnosis: Yes  Discussing patient identified problems/goals with staff: Yes Medical problems stabilized or resolved: Yes Denies suicidal/homicidal ideation: Yes Patient has not harmed self or others: Yes  For review of initial/current patient goals, please see plan of care.  Estimated Length of Stay:  Discharge today  Reasons for Continued Hospitalization:   New Problems/Goals identified:    Discharge Plan or Barriers:   Home with outpatient follow up with [facility] on Friday, 03/30/13; [facility] on 04/02/13 and [facility] on 04/03/13   Comments: N/A  Attendee:  Patient:  Eric Livingston 03/28/2013 9:49 AM   Signature: Mervyn Gay, MD 03/28/2013 9:49 AM  Signature:  Verne Spurr, PA 03/28/2013 9:49 AM  Signature:  Harold Barban, RN 03/28/2013 9:49 AM  Signature: 03/28/2013 9:49 AM  Signature:  Chinita Greenland, RN 03/28/2013 9:49 AM  Signature:  Juline Patch, LCSW 03/28/2013 9:49 AM  Signature:  Reyes Ivan, LCSW 03/28/2013 9:49 AM  Signature:  Maseta Dorley,Care Coordinator 03/28/2013 9:49 AM  Signature: 03/28/2013 9:49 AM  Signature:    Signature:    Signature:      Scribe for Treatment Team:   Juline Patch,  03/28/2013 9:49 AM

## 2013-03-29 ENCOUNTER — Emergency Department (HOSPITAL_COMMUNITY)
Admission: EM | Admit: 2013-03-29 | Discharge: 2013-03-30 | Disposition: A | Discharge status: Other Acute Inpatient Hospital | Payer: Medicaid Other | Attending: Emergency Medicine | Admitting: Emergency Medicine

## 2013-03-29 ENCOUNTER — Encounter (HOSPITAL_COMMUNITY): Payer: Self-pay | Admitting: Emergency Medicine

## 2013-03-29 DIAGNOSIS — Z79899 Other long term (current) drug therapy: Secondary | ICD-10-CM | POA: Insufficient documentation

## 2013-03-29 DIAGNOSIS — F411 Generalized anxiety disorder: Secondary | ICD-10-CM | POA: Insufficient documentation

## 2013-03-29 DIAGNOSIS — F17200 Nicotine dependence, unspecified, uncomplicated: Secondary | ICD-10-CM | POA: Insufficient documentation

## 2013-03-29 DIAGNOSIS — F1020 Alcohol dependence, uncomplicated: Secondary | ICD-10-CM | POA: Insufficient documentation

## 2013-03-29 DIAGNOSIS — R45851 Suicidal ideations: Secondary | ICD-10-CM | POA: Insufficient documentation

## 2013-03-29 DIAGNOSIS — I10 Essential (primary) hypertension: Secondary | ICD-10-CM | POA: Insufficient documentation

## 2013-03-29 DIAGNOSIS — R45850 Homicidal ideations: Secondary | ICD-10-CM | POA: Insufficient documentation

## 2013-03-29 DIAGNOSIS — IMO0002 Reserved for concepts with insufficient information to code with codable children: Secondary | ICD-10-CM | POA: Insufficient documentation

## 2013-03-29 DIAGNOSIS — F3289 Other specified depressive episodes: Secondary | ICD-10-CM | POA: Insufficient documentation

## 2013-03-29 DIAGNOSIS — F329 Major depressive disorder, single episode, unspecified: Secondary | ICD-10-CM | POA: Insufficient documentation

## 2013-03-29 LAB — BASIC METABOLIC PANEL
CO2: 24 mEq/L (ref 19–32)
Chloride: 107 mEq/L (ref 96–112)
GFR calc Af Amer: 84 mL/min — ABNORMAL LOW (ref >90)
Sodium: 143 mEq/L (ref 135–145)

## 2013-03-29 LAB — RAPID URINE DRUG SCREEN, HOSP PERFORMED
Amphetamines: NOT DETECTED
Cocaine: NOT DETECTED
Opiates: NOT DETECTED
Tetrahydrocannabinol: NOT DETECTED

## 2013-03-29 LAB — URINALYSIS, ROUTINE W REFLEX MICROSCOPIC
Bilirubin Urine: NEGATIVE
Glucose, UA: NEGATIVE mg/dL
Hgb urine dipstick: NEGATIVE
Protein, ur: NEGATIVE mg/dL
Specific Gravity, Urine: 1.02 (ref 1.005–1.030)
Urobilinogen, UA: 0.2 mg/dL (ref 0.0–1.0)

## 2013-03-29 LAB — CBC WITH DIFFERENTIAL/PLATELET
Basophils Absolute: 0.1 10*3/uL (ref 0.0–0.1)
Lymphocytes Relative: 27 % (ref 12–46)
Neutro Abs: 2.7 10*3/uL (ref 1.7–7.7)
Neutrophils Relative %: 54 % (ref 43–77)
Platelets: 276 10*3/uL (ref 150–400)
RDW: 13.7 % (ref 11.5–15.5)
WBC: 5.1 10*3/uL (ref 4.0–10.5)

## 2013-03-29 LAB — ACETAMINOPHEN LEVEL: Acetaminophen (Tylenol), Serum: <15 ug/mL (ref 10–30)

## 2013-03-29 MED ORDER — DIVALPROEX SODIUM ER 500 MG PO TB24
500.0000 mg | ORAL_TABLET | Freq: Every day | ORAL | Status: DC
  Administered 2013-03-29: 500 mg via ORAL
  Filled 2013-03-29 (×2): qty 1

## 2013-03-29 MED ORDER — NICOTINE 21 MG/24HR TD PT24
21.0000 mg | MEDICATED_PATCH | Freq: Every day | TRANSDERMAL | Status: DC
  Administered 2013-03-29 – 2013-03-30 (×2): 21 mg via TRANSDERMAL
  Filled 2013-03-29 (×2): qty 1

## 2013-03-29 MED ORDER — ADULT MULTIVITAMIN W/MINERALS CH
1.0000 | ORAL_TABLET | Freq: Every day | ORAL | Status: DC
  Administered 2013-03-29 – 2013-03-30 (×2): 1 via ORAL
  Filled 2013-03-29 (×2): qty 1

## 2013-03-29 MED ORDER — ZOLPIDEM TARTRATE 5 MG PO TABS
5.0000 mg | ORAL_TABLET | Freq: Every evening | ORAL | Status: DC | PRN
  Administered 2013-03-29: 5 mg via ORAL
  Filled 2013-03-29: qty 1

## 2013-03-29 MED ORDER — LISINOPRIL 10 MG PO TABS
10.0000 mg | ORAL_TABLET | Freq: Every day | ORAL | Status: DC
  Administered 2013-03-29 – 2013-03-30 (×2): 10 mg via ORAL
  Filled 2013-03-29 (×2): qty 1

## 2013-03-29 MED ORDER — ACETAMINOPHEN 325 MG PO TABS: 650.0000 mg | ORAL_TABLET | ORAL | Status: DC | PRN

## 2013-03-29 MED ORDER — ONDANSETRON 8 MG PO TBDP
8.0000 mg | ORAL_TABLET | Freq: Once | ORAL | Status: AC
  Administered 2013-03-29: 8 mg via ORAL
  Filled 2013-03-29: qty 1

## 2013-03-29 MED ORDER — QUETIAPINE FUMARATE 100 MG PO TABS
100.0000 mg | ORAL_TABLET | Freq: Every day | ORAL | Status: DC
  Administered 2013-03-29: 100 mg via ORAL
  Filled 2013-03-29: qty 1

## 2013-03-29 MED ORDER — LORAZEPAM 1 MG PO TABS
1.0000 mg | ORAL_TABLET | Freq: Once | ORAL | Status: AC
  Administered 2013-03-29: 1 mg via ORAL
  Filled 2013-03-29: qty 1

## 2013-03-29 MED ORDER — ACETAMINOPHEN 325 MG PO TABS
650.0000 mg | ORAL_TABLET | ORAL | Status: DC | PRN
  Administered 2013-03-29: 650 mg via ORAL
  Filled 2013-03-29: qty 2

## 2013-03-29 MED ORDER — ALUM & MAG HYDROXIDE-SIMETH 200-200-20 MG/5ML PO SUSP: 30.0000 mL | ORAL | Status: DC | PRN

## 2013-03-29 MED ORDER — ONDANSETRON HCL 4 MG PO TABS: 4.0000 mg | ORAL_TABLET | Freq: Three times a day (TID) | ORAL | Status: DC | PRN

## 2013-03-29 MED ORDER — ONDANSETRON HCL 4 MG PO TABS
4.0000 mg | ORAL_TABLET | Freq: Three times a day (TID) | ORAL | Status: DC | PRN
Start: 2013-03-29 — End: 2013-03-30

## 2013-03-29 MED ORDER — LORAZEPAM 1 MG PO TABS
1.0000 mg | ORAL_TABLET | Freq: Three times a day (TID) | ORAL | Status: DC | PRN
  Administered 2013-03-30 (×2): 1 mg via ORAL
  Filled 2013-03-29 (×2): qty 1

## 2013-03-29 MED ORDER — IBUPROFEN 200 MG PO TABS: 600.0000 mg | ORAL_TABLET | Freq: Three times a day (TID) | ORAL | Status: DC | PRN

## 2013-03-29 NOTE — Progress Notes (Signed)
[facility] provided patient with a [employer] Orange Card application, highlighting [facility].  °
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
## 2013-03-29 NOTE — ED Notes (Signed)
Up to the bathroom 

## 2013-03-29 NOTE — ED Notes (Signed)
Up to the desk on the phone 
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
## 2013-03-30 ENCOUNTER — Inpatient Hospital Stay (HOSPITAL_COMMUNITY)
Admission: AD | Admit: 2013-03-30 | Discharge: 2013-04-03 | DRG: 897 | Disposition: A | Discharge status: Home / Self Care | Payer: Medicaid Other | Source: Intra-hospital | Attending: Psychiatry | Admitting: Psychiatry

## 2013-03-30 ENCOUNTER — Encounter (HOSPITAL_COMMUNITY): Payer: Self-pay | Admitting: *Deleted

## 2013-03-30 DIAGNOSIS — Z79899 Other long term (current) drug therapy: Secondary | ICD-10-CM

## 2013-03-30 DIAGNOSIS — F1994 Other psychoactive substance use, unspecified with psychoactive substance-induced mood disorder: Secondary | ICD-10-CM

## 2013-03-30 DIAGNOSIS — F1020 Alcohol dependence, uncomplicated: Principal | ICD-10-CM | POA: Diagnosis present

## 2013-03-30 DIAGNOSIS — I10 Essential (primary) hypertension: Secondary | ICD-10-CM | POA: Diagnosis present

## 2013-03-30 DIAGNOSIS — F329 Major depressive disorder, single episode, unspecified: Secondary | ICD-10-CM | POA: Diagnosis present

## 2013-03-30 DIAGNOSIS — R45850 Homicidal ideations: Secondary | ICD-10-CM

## 2013-03-30 DIAGNOSIS — F1920 Other psychoactive substance dependence, uncomplicated: Secondary | ICD-10-CM

## 2013-03-30 DIAGNOSIS — F3289 Other specified depressive episodes: Secondary | ICD-10-CM | POA: Diagnosis present

## 2013-03-30 MED ORDER — LORAZEPAM 1 MG PO TABS: 1.0000 mg | ORAL_TABLET | Freq: Once | ORAL | Status: DC

## 2013-03-30 MED ORDER — HYDROXYZINE HCL 50 MG PO TABS: 50.0000 mg | ORAL_TABLET | Freq: Every evening | ORAL | Status: DC | PRN

## 2013-03-30 MED ORDER — LISINOPRIL 10 MG PO TABS
10.0000 mg | ORAL_TABLET | Freq: Every day | ORAL | Status: DC
  Administered 2013-03-31 – 2013-04-02 (×3): 10 mg via ORAL
  Filled 2013-03-30 (×5): qty 1

## 2013-03-30 MED ORDER — ADULT MULTIVITAMIN W/MINERALS CH
1.0000 | ORAL_TABLET | Freq: Every day | ORAL | Status: DC
  Administered 2013-03-31 – 2013-04-02 (×3): 1 via ORAL
  Filled 2013-03-30 (×5): qty 1

## 2013-03-30 MED ORDER — QUETIAPINE FUMARATE 100 MG PO TABS
100.0000 mg | ORAL_TABLET | Freq: Every day | ORAL | Status: DC
  Administered 2013-03-30 – 2013-04-02 (×4): 100 mg via ORAL
  Filled 2013-03-30 (×6): qty 1

## 2013-03-30 MED ORDER — ALUM & MAG HYDROXIDE-SIMETH 200-200-20 MG/5ML PO SUSP: 30.0000 mL | ORAL | Status: DC | PRN

## 2013-03-30 MED ORDER — DIVALPROEX SODIUM ER 500 MG PO TB24
500.0000 mg | ORAL_TABLET | Freq: Every day | ORAL | Status: DC
  Administered 2013-03-30 – 2013-04-02 (×4): 500 mg via ORAL
  Filled 2013-03-30 (×6): qty 1

## 2013-03-30 MED ORDER — MAGNESIUM HYDROXIDE 400 MG/5ML PO SUSP: 30.0000 mL | Freq: Every day | ORAL | Status: DC | PRN

## 2013-03-30 MED ORDER — NICOTINE 21 MG/24HR TD PT24
21.0000 mg | MEDICATED_PATCH | Freq: Every day | TRANSDERMAL | Status: DC
  Administered 2013-03-31 – 2013-04-01 (×2): 21 mg via TRANSDERMAL
  Filled 2013-03-30 (×4): qty 1

## 2013-03-30 MED ORDER — ACETAMINOPHEN 325 MG PO TABS: 650.0000 mg | ORAL_TABLET | Freq: Four times a day (QID) | ORAL | Status: DC | PRN

## 2013-03-30 MED ORDER — IBUPROFEN 600 MG PO TABS: 600.0000 mg | ORAL_TABLET | Freq: Three times a day (TID) | ORAL | Status: DC | PRN

## 2013-03-30 MED ORDER — ZOLPIDEM TARTRATE 5 MG PO TABS: 5.0000 mg | ORAL_TABLET | Freq: Every evening | ORAL | Status: DC | PRN
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
## 2013-03-30 NOTE — ED Notes (Signed)
Pt expressing si and hi thoughts. Pt was holding sheet in hand expressing si thoughts. Mental health tech stayed in room with pt for an hour offering support an redirection. Pt calmer. PRN medication given. Safety maintained.

## 2013-03-30 NOTE — Progress Notes (Signed)
The focus of this group is to help patients review their daily goal of treatment and discuss progress on daily workbooks.  During wrap up group, Uri stated that he experienced some S/I this morning, and that he was feeling very emotional all day. Patient stated that one relapse prevention technique that he plans to use in the future is to "move on and be done with it" when he finds himself in difficult situations that are out of his control. When asked to name two positives about himself, patient shared that he is non-judgmental and down to earth.

## 2013-03-30 NOTE — Progress Notes (Signed)
37 year old male pt admitted on voluntary basis. Pt reports being discharged from this facility on Wednesday, August 13 and relapsed with his girlfriend. Pt reports that he was to go to [facility] on Monday, August 18 for continued treatment and hopes to be able to still do that on Monday. Pt reports some feelings of depression on admission but denies any SI and is able to contract for safety on the unit. Pt was oriented to the unit and safety maintained.

## 2013-03-30 NOTE — Tx Team (Signed)
Initial Interdisciplinary Treatment Plan  PATIENT STRENGTHS: (choose at least two) Ability for insight Average or above average intelligence Capable of independent living  PATIENT STRESSORS: Substance abuse   PROBLEM LIST: Problem List/Patient Goals Date to be addressed Date deferred Reason deferred Estimated date of resolution  Depression 03/30/13     Substance Abuse 03/30/13                                                DISCHARGE CRITERIA:  Ability to meet basic life and health needs Improved stabilization in mood, thinking, and/or behavior Verbal commitment to aftercare and medication compliance  PRELIMINARY DISCHARGE PLAN: Attend aftercare/continuing care group  PATIENT/FAMIILY INVOLVEMENT: This treatment plan has been presented to and reviewed with the patient, Eric Livingston, and/or family member, .  The patient and family have been given the opportunity to ask questions and make suggestions.  Maisen Schmit, [place] 03/30/2013, 5:41 PM

## 2013-03-30 NOTE — Progress Notes (Signed)
Patient accepted to [facility], to Dr. Carmelina Dane. Pt to be transported by secuirity, patient support paperwork completed and faxed.   Catha Gosselin, LCSW [phone]  ED CSW .03/30/2013 1450pm
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
## 2013-03-31 DIAGNOSIS — F1994 Other psychoactive substance use, unspecified with psychoactive substance-induced mood disorder: Secondary | ICD-10-CM

## 2013-03-31 DIAGNOSIS — F4310 Post-traumatic stress disorder, unspecified: Secondary | ICD-10-CM

## 2013-03-31 DIAGNOSIS — F329 Major depressive disorder, single episode, unspecified: Secondary | ICD-10-CM

## 2013-03-31 DIAGNOSIS — F1010 Alcohol abuse, uncomplicated: Secondary | ICD-10-CM

## 2013-03-31 MED ORDER — CHLORDIAZEPOXIDE HCL 25 MG PO CAPS: 25.0000 mg | ORAL_CAPSULE | Freq: Four times a day (QID) | ORAL | Status: DC | PRN

## 2013-03-31 NOTE — [facility] Group Notes (Signed)
[facility] Group Notes:  (Clinical Social Work)  03/31/2013   3:00-4:00PM  Summary of Progress/Problems:   The main focus of today's process group was for the patient to identify ways in which they have sabotaged their own mental health wellness/recovery.  Motivational interviewing was used to explore the reasons they engage in this behavior, and reasons they may have for wanting to change.  The Stages of Change were explained to the group using a handout, and patients identified where they are with regard to changing self-defeating behaviors.  The patient expressed that he self-sabotaged this time by going back to stay with girlfriend, giving her another chance, when in fact all she wanted was for him to get drunk again and return to the way things were.  He stated over and over that he did not understand how she could do that.  CSW gently but directly challenged him to start his sentences with "I" instead of "she", such as "Why did I let her talk me back?"  He started to demonstrate increased insight at CSW continued to remind him throughout group.  Type of Therapy:  Process Group  Participation Level:  Active  Participation Quality:  Attentive and Sharing  Affect:  Blunted, Depressed and Irritable  Cognitive:  Appropriate and Oriented  Insight:  Developing/Improving  Engagement in Therapy:  Engaged  Modes of Intervention:  Education, Motivational Interviewing   Ambrose Mantle, LCSW 03/31/2013, 4:40 PM
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
## 2013-03-31 NOTE — Progress Notes (Signed)
Adult Psychoeducational Group Note  Date:  03/31/2013 Time:  1:15PM  Group Topic/Focus:  Therapeutic Activity  Participation Level:  Active  Participation Quality:  Appropriate and Sharing  Affect:  Appropriate  Cognitive:  Appropriate  Insight: Appropriate  Engagement in Group:  Engaged  Modes of Intervention:  Activity  Additional Comments:   Pt was active in the group session and participated in the activity without complication.    Zacarias Pontes R 03/31/2013, 3:09 PM
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
## 2013-03-31 NOTE — H&P (Signed)
  Pt was seen by me today and I agree with the key elements documented in H&P.  

## 2013-03-31 NOTE — Progress Notes (Signed)
[facility] Group Notes:  (Nursing/MHT/Case Management/Adjunct)  Date:  03/31/2013  Time:  10:04 PM  Type of Therapy:  Psychoeducational Skills  Participation Level:  Minimal  Participation Quality:  Appropriate  Affect:  Appropriate  Cognitive:  Appropriate  Insight:  Appropriate  Engagement in Group:  Engaged  Modes of Intervention:  Support  Summary of Progress/Problems: Pt. Mentioned that he is interested in attending a 28 day program upon his discharged. Pt also mentioned that he will pursue trying to obtain custody of his daughter and have her move to [place].   [name] 03/31/2013, 10:04 PM

## 2013-03-31 NOTE — Progress Notes (Signed)
Psychoeducational Group Note  Date: 03/31/2013 Time:  1015  Group Topic/Focus:  Identifying Needs:   The focus of this group is to help patients identify their personal needs that have been historically problematic and identify healthy behaviors to address their needs.  Participation Level:  Active  Participation Quality:  Appropriate  Affect:  Appropriate  Cognitive:  Appropriate  Insight:  Improving  Engagement in Group:  Engaged  Additional Comments:    Toma Arts A  

## 2013-03-31 NOTE — [facility] Counselor (Signed)
Adult Psychosocial Assessment Update Interdisciplinary Team  Previous Behavior Health Hospital admissions/discharges:  Admissions Discharges  Date: 03/24/13 Date:  03/28/13  Date: Date:  Date: Date:  Date: Date:  Date: Date:   Changes since the last Psychosocial Assessment (including adherence to outpatient mental health and/or substance abuse treatment, situational issues contributing to decompensation and/or relapse). Went home to girlfriend, and when he walked in the door, lots of people were there with alcohol like a welcome home party.  He smoked marijuana, drank to the point of being intoxicated, and left in anger.  Ended up being picked up by [employer], taken to [facility].             Discharge Plan 1. Will you be returning to the same living situation after discharge?   Yes: No:  XX      If no, what is your plan?  Wants to go to treatment           2. Would you like a referral for services when you are discharged? Yes:  XX   If yes, for what services?  No:       Wants to go to [facility].       Summary and Recommendations (to be completed by the evaluator) This is a 37yo African American male who was just discharged, immediately began drinking and using marijuana.  He does not want to return to home with girlfriend, and wants to go to rehab from here.  He would benefit from safety monitoring, medication evaluation, psychoeducation, group therapy, and discharge planning to link with ongoing resources.                        Signature:  Sarina Ser, 03/31/2013 4:47 PM

## 2013-03-31 NOTE — [facility] Suicide Risk Assessment (Signed)
Suicide Risk Assessment  Nursing information obtained from:  Demographic factors: male Current Mental Status: SI thoughts now, mood is sad Loss Factors: unable to fucntion well Historical Factors: not working Risk Reduction Factors: wants to get bettter  CLINICAL FACTORS:  Depression: Comorbid alcohol abuse/dependence  Impulsivity  Alcohol/Substance Abuse/Dependencies  COGNITIVE FEATURES THAT CONTRIBUTE TO RISK:  Closed-mindedness  Polarized thinking  Thought constriction (tunnel vision)  SUICIDE RISK:  Moderate: Frequent suicidal ideation with limited intensity, and duration, some specificity in terms of plans, no associated intent, good self-control, limited dysphoria/symptomatology, some risk factors present, and identifiable protective factors, including available and accessible social support.    PLAN OF CARE: Supportive approach/coping skills/relapse prevention   Detox/reassess and address the co morbidities    I certify that inpatient services furnished can reasonably be expected to improve the patient's condition.

## 2013-04-01 DIAGNOSIS — F1020 Alcohol dependence, uncomplicated: Principal | ICD-10-CM

## 2013-04-01 NOTE — Progress Notes (Signed)
Psychoeducational Group Note  Psychoeducational Group Note  Date: 04/01/2013 Time:  04/01/2013  Group Topic/Focus:  Gratefulness:  The focus of this group is to help patients identify what two things they are most grateful for in their lives. What helps ground them and to center them on their work to their recovery.  Participation Level:  Active  Participation Quality:  Appropriate  Affect:  Appropriate  Cognitive:  Appropriate  Insight:  Improving  Engagement in Group:  Engaged  Additional Comments:    Naithen Rivenburg A  

## 2013-04-01 NOTE — Progress Notes (Signed)
Adult Psychoeducational Group Note  Date:  04/01/2013 Time:  5:29 PM  Group Topic/Focus:  Making Healthy Choices:   The focus of this group is to help patients identify negative/unhealthy choices they were using prior to admission and identify positive/healthier coping strategies to replace them upon discharge.  Participation Level:  Minimal  Participation Quality:  Attentive  Affect:  Appropriate  Cognitive:  Appropriate  Insight: Limited  Engagement in Group:  Limited  Modes of Intervention:  Activity and Socialization  Additional Comments:    Malachy Moan 04/01/2013, 5:29 PM

## 2013-04-01 NOTE — Progress Notes (Signed)
Adult Psychoeducational Group Note  Date:  04/01/2013 Time:  1:15PM  Group Topic/Focus:  Identifying Needs:   The focus of this group is to help patients identify their personal needs that have been historically problematic and identify healthy behaviors to address their needs.  Participation Level:  Active  Participation Quality:  Appropriate  Affect:  Appropriate  Cognitive:  Appropriate  Insight: Good  Engagement in Group:  Engaged  Modes of Intervention:  Activity and Discussion  Additional Comments:  Pt was an active participant in the group session as Staff discussed the importance of a healthy support system. Pt also engaged in a therapeutic activity where they learned their personal love language.   Zacarias Pontes R 04/01/2013, 6:31 PM

## 2013-04-01 NOTE — Progress Notes (Signed)
Patient ID: Eric Livingston, male   DOB: 11/05/1975, 37 y.o.   MRN: [id number] D: Pt is awake and active on the unit this AM. Pt endorses passive SI but he is able contract for safety. Pt rates their depression at 5 and hopelessness at 3. Pt's mood is sad and his affect is anxious/depressed. Pt writes that he is "trying to do better." Pt has been calling his ex-girlfriend by telephone to engage with her. Pt still seems ambivalent about their relationship and undecided what he will do after discharge. Writer encouraged pt to think carefully before reentering a relationship with his ex, because of their past hx. Pt also understands he is responsible for any decision he makes. Pt is receptive to staff input, and is attending groups.   A: Encouraged pt to discuss feelings with staff and administered medication per MD orders. Writer also encouraged pt to participate in groups.  R: Pt is attending groups and tolerating medications well. Writer will continue to monitor. 15 minute checks are ongoing for safety.

## 2013-04-01 NOTE — Progress Notes (Signed)
Patient ID: Eric Livingston, male   DOB: 09-May-1976, 37 y.o.   MRN: [id number] D)  Has been brighter this evening, states is planning to keep his focus on staying drug and alcohol free.  Has spent some time on the phone this evening,asked about who he was speaking with on the phone, couldn't look this writer in the eye and admit he hadn't spoken to the girlfriend.  Stated he had needed to be reminded to stay strong and focused.  Denies any sx of withdrawal, pleasant, cooperative. Interacting appropriately with staff and peers,still planning to go to [facility] Monday. A)  Will continue to monitor for safety, continue POC, encourage to look at long term goals. R)  Remains safe on unit, a safe environment.

## 2013-04-01 NOTE — [facility] Group Notes (Signed)
[facility] Group Notes: (Clinical Social Work)   04/01/2013      Type of Therapy:  Group Therapy   Participation Level:  Did Not Attend    Ambrose Mantle, LCSW 04/01/2013, 4:11 PM

## 2013-04-01 NOTE — Progress Notes (Signed)
Psychoeducational Group Note  Date:  04/01/2013 Time:  1015  Group Topic/Focus:  Making Healthy Choices:   The focus of this group is to help patients identify negative/unhealthy choices they were using prior to admission and identify positive/healthier coping strategies to replace them upon discharge.  Participation Level:  Active  Participation Quality:  Attentive  Affect:  Appropriate  Cognitive:  Appropriate  Insight:  Engaged  Engagement in Group:  Engaged  Additional Comments:    Malayasia Mirkin A 04/01/2013 

## 2013-04-01 NOTE — Progress Notes (Signed)
Patient did attend the evening speaker AA meeting.  

## 2013-04-01 NOTE — ED Provider Notes (Signed)
Medical screening examination/treatment/procedure(s) were performed by non-physician practitioner and as supervising physician I was immediately available for consultation/collaboration.  Myha Arizpe, MD 04/01/13 1645 

## 2013-04-01 NOTE — Progress Notes (Signed)
[facility] MD Progress Note  04/01/2013 2:21 PM Eric Livingston  MRN:  [id number] Subjective:  Relapsed 15 minutes after he was discharged when he went to his ex girlfriend's house. He has a bed this week at [facility]. Now notes he is extremely depressed, very embarrassed. Some crying, anxiety, but states he is still motivated to continue trying to pursue his sobriety. Diagnosis:  Alcohol dependence syndrome   ADL's:  Intact  Sleep: Poor  Appetite:  Fair  Suicidal Ideation:  Some but not it's off and on Homicidal Ideation:  none AEB (as evidenced by):  Psychiatric Specialty Exam: Review of Systems  Constitutional: Negative.  Negative for fever, chills, weight loss, malaise/fatigue and diaphoresis.  HENT: Negative for congestion and sore throat.   Eyes: Negative for blurred vision, double vision and photophobia.  Respiratory: Negative for cough, shortness of breath and wheezing.   Cardiovascular: Negative for chest pain, palpitations and PND.  Gastrointestinal: Negative for heartburn, nausea, vomiting, abdominal pain, diarrhea and constipation.  Musculoskeletal: Negative for myalgias, joint pain and falls.  Neurological: Negative for dizziness, tingling, tremors, sensory change, speech change, focal weakness, seizures, loss of consciousness, weakness and headaches.  Endo/Heme/Allergies: Negative for polydipsia. Does not bruise/bleed easily.  Psychiatric/Behavioral: Negative for depression, suicidal ideas, hallucinations, memory loss and substance abuse. The patient is not nervous/anxious and does not have insomnia.     Blood pressure 140/95, pulse 100, temperature 97.1 F (36.2 C), temperature source Oral, resp. rate 20, height 6\' 3"  (1.905 m), weight 120.203 kg (265 lb).Body mass index is 33.12 kg/(m^2).  General Appearance: Casual  Eye Contact::  Fair  Speech:  Clear and Coherent  Volume:  Normal  Mood:  Depressed  Affect:  Constricted  Thought Process:  Goal Directed  Orientation:   Full (Time, Place, and Person)  Thought Content:  WDL  Suicidal Thoughts:  Yes.  without intent/plan  Homicidal Thoughts:  No  Memory:  NA  Judgement:  Impaired  Insight:  Present  Psychomotor Activity:  Normal  Concentration:  Fair  Recall:  Fair  Akathisia:  No  Handed:  Right  AIMS (if indicated):     Assets:  Communication Skills Desire for Improvement Physical Health  Sleep:  Number of Hours: 6   Current Medications: Current Facility-Administered Medications  Medication Dose Route Frequency Provider Last Rate Last Dose  . acetaminophen (TYLENOL) tablet 650 mg  650 mg Oral Q6H PRN Earney Navy, NP      . alum & mag hydroxide-simeth (MAALOX/MYLANTA) 200-200-20 MG/5ML suspension 30 mL  30 mL Oral Q4H PRN Earney Navy, NP      . chlordiazePOXIDE (LIBRIUM) capsule 25 mg  25 mg Oral Q6H PRN Fransisca Kaufmann, NP      . divalproex (DEPAKOTE ER) 24 hr tablet 500 mg  500 mg Oral QHS Earney Navy, NP   500 mg at 03/31/13 2155  . hydrOXYzine (ATARAX/VISTARIL) tablet 50 mg  50 mg Oral QHS PRN Earney Navy, NP      . ibuprofen (ADVIL,MOTRIN) tablet 600 mg  600 mg Oral Q8H PRN Earney Navy, NP      . lisinopril (PRINIVIL,ZESTRIL) tablet 10 mg  10 mg Oral Daily Earney Navy, NP   10 mg at 04/01/13 0742  . magnesium hydroxide (MILK OF MAGNESIA) suspension 30 mL  30 mL Oral Daily PRN Earney Navy, NP      . multivitamin with minerals tablet 1 tablet  1 tablet Oral Daily Earney Navy, NP  1 tablet at 04/01/13 0742  . nicotine (NICODERM CQ - dosed in mg/24 hours) patch 21 mg  21 mg Transdermal Daily Earney Navy, NP   21 mg at 04/01/13 0742  . QUEtiapine (SEROQUEL) tablet 100 mg  100 mg Oral QHS Earney Navy, NP   100 mg at 03/31/13 2155  . zolpidem (AMBIEN) tablet 5 mg  5 mg Oral QHS PRN Earney Navy, NP        Lab Results: No results found for this or any previous visit (from the past 48 hour(s)).  Physical Findings: AIMS: Facial  and Oral Movements Muscles of Facial Expression: None, normal Lips and Perioral Area: None, normal Jaw: None, normal Tongue: None, normal,Extremity Movements Upper (arms, wrists, hands, fingers): None, normal Lower (legs, knees, ankles, toes): None, normal, Trunk Movements Neck, shoulders, hips: None, normal, Overall Severity Severity of abnormal movements (highest score from questions above): None, normal Incapacitation due to abnormal movements: None, normal Patient's awareness of abnormal movements (rate only patient's report): No Awareness, Dental Status Current problems with teeth and/or dentures?: No Does patient usually wear dentures?: No  CIWA:    COWS:     Treatment Plan Summary: Daily contact with patient to assess and evaluate symptoms and progress in treatment Medication management  Plan: 1. Continue crisis management and stabilization. 2. Medication management to reduce current symptoms to base line and improve patient's overall level of functioning 3. Treat health problems as indicated. 4. Develop treatment plan to decrease risk of relapse upon discharge and the need for readmission. 5. Psycho-social education regarding relapse prevention and self care. 6. Health care follow up as needed for medical problems. 7. Continue home medications where appropriate. 8. Patient is encouraged to contact [facility] today if possible, or [facility] as discussed.   Medical Decision Making Problem Points:  Established problem, stable/improving (1) Data Points:  Review or order medicine tests (1)  I certify that inpatient services furnished can reasonably be expected to improve the patient's condition.  Rona Ravens. Corinna Burkman RPAC 2:28 PM 04/01/2013

## 2013-04-02 MED ORDER — ADULT MULTIVITAMIN W/MINERALS CH: 1.0000 | ORAL_TABLET | Freq: Every day | ORAL | Status: DC

## 2013-04-02 MED ORDER — QUETIAPINE FUMARATE 100 MG PO TABS: 100.0000 mg | ORAL_TABLET | Freq: Every day | ORAL | Status: DC

## 2013-04-02 MED ORDER — HYDROXYZINE HCL 50 MG PO TABS: 50.0000 mg | ORAL_TABLET | Freq: Every evening | ORAL | Status: DC | PRN

## 2013-04-02 MED ORDER — DIVALPROEX SODIUM ER 500 MG PO TB24: 500.0000 mg | ORAL_TABLET | Freq: Every day | ORAL | Status: DC

## 2013-04-02 MED ORDER — LISINOPRIL 10 MG PO TABS: 10.0000 mg | ORAL_TABLET | Freq: Every day | ORAL | Status: DC

## 2013-04-02 NOTE — BHH Group Notes (Signed)
[facility] LCSW Aftercare Discharge Planning Group Note   04/02/2013 8:45 AM  Participation Quality:  Alert and Appropriate   Mood/Affect:  Appropriate  Depression Rating:  0  Anxiety Rating:  0  Thoughts of Suicide:  Pt denies SI/HI  Will you contract for safety?   Yes  Current AVH: Pt denies  Plan for Discharge/Comments:  Pt attended discharge planning group and actively participated in group.  CSW provided pt with today's workbook.  Pt states that he was scheduled to go to [facility] or [facility] a week ago but relapsed.  Pt is interested in referral to either [facility] or [facility].  CSW will assess for appropriate referrals.  No further needs voiced by pt at this time.    Transportation Means: Pt has access to transportation  Supports: No supports mentioned at this time  Eric Livingston, LCSWA 04/02/2013 10:02 AM

## 2013-04-02 NOTE — Progress Notes (Signed)
(  Discharge tomorrow) [facility] Adult Case Management Discharge Plan :  Will you be returning to the same living situation after discharge: Yes,  can return home after treatment At discharge, do you have transportation home?:Yes,  access to transportation, provided a bus pass Do you have the ability to pay for your medications:Yes,  access to meds  Release of information consent forms completed and in the chart;  Patient's signature needed at discharge.  Patient to Follow up at: Follow-up Information   Follow up with [facility] On 04/03/2013. (Arrive at 8:00 am promptly for assessment and possible admission.  )    Contact information:   [address] [place], [place] [address] Phone: [phone] Fax: [phone]      Patient denies SI/HI:   Yes,  denies SI/HI    Safety Planning and Suicide Prevention discussed:  Yes,  discussed with pt, pt refused contact with family/friend.  See suicide prevention education note.    Carmina Miller 04/02/2013, 2:36 PM

## 2013-04-02 NOTE — Progress Notes (Signed)
[facility] MD Progress Note  04/02/2013 3:26 PM Eric Livingston  MRN:  [id number] Subjective:  Was discharged from this hospital last week with an appointment to [facility] August the 19. He relapsed and came back for detox. He states he is ready to go to [facility] in the morning and continue to work on long term abstinence Diagnosis:  Alcohol Dependence, Substance Induced Mood Disorder  ADL's:  Intact  Sleep: Fair  Appetite:  Fair  Suicidal Ideation:  Plan:  Denies Intent:  denies Means:  denies Homicidal Ideation:  Plan:  denies Intent:  denies Means:  denies AEB (as evidenced by):  Psychiatric Specialty Exam: Review of Systems  Constitutional: Negative.   HENT: Negative.   Eyes: Negative.   Respiratory: Negative.   Cardiovascular: Negative.   Gastrointestinal: Negative.   Genitourinary: Negative.   Musculoskeletal: Negative.   Skin: Negative.   Neurological: Negative.   Endo/Heme/Allergies: Negative.   Psychiatric/Behavioral: Positive for substance abuse.    Blood pressure 122/92, pulse 90, temperature 97 F (36.1 C), temperature source Oral, resp. rate 20, height 6\' 3"  (1.905 m), weight 120.203 kg (265 lb).Body mass index is 33.12 kg/(m^2).  General Appearance: Fairly Groomed  Patent attorney::  Fair  Speech:  Clear and Coherent  Volume:  Normal  Mood:  Anxious and worried  Affect:  Restricted  Thought Process:  Coherent and Goal Directed  Orientation:  Full (Time, Place, and Person)  Thought Content:  worries, concerns  Suicidal Thoughts:  No  Homicidal Thoughts:  No  Memory:  Immediate;   Fair Recent;   Fair Remote;   Fair  Judgement:  Fair  Insight:  Superficial  Psychomotor Activity:  Restlessness  Concentration:  Fair  Recall:  Fair  Akathisia:  No  Handed:  Right  AIMS (if indicated):     Assets:  Desire for Improvement  Sleep:  Number of Hours: 5.75   Current Medications: Current Facility-Administered Medications  Medication Dose Route Frequency Provider  Last Rate Last Dose  . acetaminophen (TYLENOL) tablet 650 mg  650 mg Oral Q6H PRN Earney Navy, NP      . alum & mag hydroxide-simeth (MAALOX/MYLANTA) 200-200-20 MG/5ML suspension 30 mL  30 mL Oral Q4H PRN Earney Navy, NP      . chlordiazePOXIDE (LIBRIUM) capsule 25 mg  25 mg Oral Q6H PRN Fransisca Kaufmann, NP      . divalproex (DEPAKOTE ER) 24 hr tablet 500 mg  500 mg Oral QHS Earney Navy, NP   500 mg at 04/01/13 2147  . hydrOXYzine (ATARAX/VISTARIL) tablet 50 mg  50 mg Oral QHS PRN Earney Navy, NP      . ibuprofen (ADVIL,MOTRIN) tablet 600 mg  600 mg Oral Q8H PRN Earney Navy, NP      . lisinopril (PRINIVIL,ZESTRIL) tablet 10 mg  10 mg Oral Daily Earney Navy, NP   10 mg at 04/02/13 0813  . magnesium hydroxide (MILK OF MAGNESIA) suspension 30 mL  30 mL Oral Daily PRN Earney Navy, NP      . multivitamin with minerals tablet 1 tablet  1 tablet Oral Daily Earney Navy, NP   1 tablet at 04/02/13 0813  . QUEtiapine (SEROQUEL) tablet 100 mg  100 mg Oral QHS Earney Navy, NP   100 mg at 04/01/13 2147  . zolpidem (AMBIEN) tablet 5 mg  5 mg Oral QHS PRN Earney Navy, NP        Lab Results: No results found for  this or any previous visit (from the past 48 hour(s)).  Physical Findings: AIMS: Facial and Oral Movements Muscles of Facial Expression: None, normal Lips and Perioral Area: None, normal Jaw: None, normal Tongue: None, normal,Extremity Movements Upper (arms, wrists, hands, fingers): None, normal Lower (legs, knees, ankles, toes): None, normal, Trunk Movements Neck, shoulders, hips: None, normal, Overall Severity Severity of abnormal movements (highest score from questions above): None, normal Incapacitation due to abnormal movements: None, normal Patient's awareness of abnormal movements (rate only patient's report): No Awareness, Dental Status Current problems with teeth and/or dentures?: No Does patient usually wear dentures?: No   CIWA:    COWS:     Treatment Plan Summary: Daily contact with patient to assess and evaluate symptoms and progress in treatment Medication management  Plan: Supportive approach/coping skills/relapse prevention           To be admitted to [facility] in the morning  Medical Decision Making Problem Points:  Review of last therapy session (1) Data Points:  Review of medication regiment & side effects (2)  I certify that inpatient services furnished can reasonably be expected to improve the patient's condition.   Janaia Kozel A 04/02/2013, 3:26 PM

## 2013-04-02 NOTE — Progress Notes (Signed)
Patient ID: Eric Livingston, male   DOB: 1976/05/30, 37 y.o.   MRN: [id number] He was in group this afternoon and became overwhelmed after hearing people tale about their discharge pan. Stated that he was torn between  Going back to work or to treatment. Advised thst he may want to call his employer and talk to him. He has agreed to do this.

## 2013-04-02 NOTE — Discharge Summary (Signed)
Physician Discharge Summary Note  Patient:  Eric Livingston is an 37 y.o., male MRN:  [id number] DOB:  Jun 01, 1976 Patient phone:  [phone] (home)  Patient address:   [address] [place] [address],   Date of Admission:  03/30/2013 Date of Discharge: 04/02/13  Reason for Admission:  Alcohol detox  Discharge Diagnoses: Principal Problem:   Alcohol dependence syndrome  Review of Systems  Constitutional: Negative.   HENT: Negative.   Eyes: Negative.   Respiratory: Negative.   Cardiovascular: Negative.   Gastrointestinal: Negative.   Genitourinary: Negative.   Musculoskeletal: Negative.   Skin: Negative.   Neurological: Negative.   Endo/Heme/Allergies: Negative.   Psychiatric/Behavioral: Positive for depression (Stabilized with medication prior to discharge) and substance abuse (Alcoholism). Negative for suicidal ideas, hallucinations and memory loss. The patient is nervous/anxious (Stabilized with medication prior to discharge) and has insomnia (Stabilized with medication prior to discharge).    Axis Diagnosis:   AXIS I:  Substance Induced Mood Disorder and Alcohol dependence AXIS II:  Deferred AXIS III:   Past Medical History  Diagnosis Date  . Hypertension   . Alcohol abuse   . Depression    AXIS IV:  Polysubstance abuse/dependence AXIS V:  63  Level of Care:  [facility]  Hospital Course: This is a 37 year old male who presented to [facility] with depression and SI. He was recently discharged from [facility] and relapsed. Patient states "I thought everything would be ok. I went back home and my girlfriend had ETOH and drugs waiting for me. I feel so betrayed. After she told Quylle she would help keep me clean. I feel so hopeless. I tried to drink myself to death just like I did before. I was so drunk I did not know what I was doing. I am so depressed. I want help." The patient was redirected from focusing solely on the situation with his girlfriend and on himself. The  patient does not take any responsibility for his actions. He reports that he is done with his ex-girlfriend. Patient reports that he has Disability pending and wants to start over fresh.  Mr. Shepard's stay in this hospital was rather very brief. He came in with UDS positive for Alcohol at 52 and (+) Benzodiazepine. It was apparent that he was intoxicated needing detoxification treatment protocol to stabilize his systems of alcohol intoxication and to combat the withdrawal symptoms as well. He was then started on Librium 25 mg detoxification treatment protocol on a PRN basis for his alcohol/benzodiazepine intoxication detoxification treatment. He was also enrolled in group counseling sessions/activities to learn coping skills that should help him after discharge to cope better and manage his substance abuse issues for a much longer sobriety. Mr. Idalia Needle presents other pre-existing/identifyable medical issues and or concerns that required monitoring and or treatments. He received medication management and monitoring for all those health issues as well. He was monitored closely for any potential problems that may arise as a result of and or during detoxification treatment. Patient tolerated his detoxification treatment/other treatment regimen without any significant adverse effects and reactions presented.  Besides detoxification treatment protocol, Mr. Mccleod also was ordered and received Depakote ER 500 mg Q bedtime for mood stabilization, Hydroxyzine 50 mg Q bedtime for tension/sleep and Seroquel 100 mg Q bedtime for mood control. He was also enrolled in group counseling sessions/activities to learn coping skills that should help him after discharge to cope better and manage his substance abuse issues for a much longer sobriety. He  also briefly attended AA/NA meetings being offered and held within this unit. It happens that a bed becomes available today at the [facility] and patient happened to be on the  list to continue substance abuse treatment after discharge from this hospital. Although his stay in this hospital is very brief, patient appeared stabilized to be discharged to start treatment immediately. A decision was reached between patient and his treatment team staff to be discharged to [facility] today.   Upon discharge, patient adamantly denies any suicidal, homicidal ideations, auditory, visual hallucinations, delusional thoughts, paranoia and or withdrawal symptoms. He received a 30 day worth of prescriptions for all his [facility] discharge medications. He left [facility] with all personal belongings in no apparent distress via [facility] bus transport. Bus pass provided by [facility].  Consults:  psychiatry  Significant Diagnostic Studies:  labs: CBC with diff, CMP, UDS, Toxicology tests, U/A  Discharge Vitals:   Blood pressure 122/92, pulse 90, temperature 97 F (36.1 C), temperature source Oral, resp. rate 20, height 6\' 3"  (1.905 m), weight 120.203 kg (265 lb). Body mass index is 33.12 kg/(m^2). Lab Results:   No results found for this or any previous visit (from the past 72 hour(s)).  Physical Findings: AIMS: Facial and Oral Movements Muscles of Facial Expression: None, normal Lips and Perioral Area: None, normal Jaw: None, normal Tongue: None, normal,Extremity Movements Upper (arms, wrists, hands, fingers): None, normal Lower (legs, knees, ankles, toes): None, normal, Trunk Movements Neck, shoulders, hips: None, normal, Overall Severity Severity of abnormal movements (highest score from questions above): None, normal Incapacitation due to abnormal movements: None, normal Patient's awareness of abnormal movements (rate only patient's report): No Awareness, Dental Status Current problems with teeth and/or dentures?: No Does patient usually wear dentures?: No  CIWA:    COWS:     Psychiatric Specialty Exam: See Psychiatric Specialty Exam and Suicide Risk Assessment completed by Attending  Physician prior to discharge.  Discharge destination:  [facility]  Is patient on multiple antipsychotic therapies at discharge:  No   Has Patient had three or more failed trials of antipsychotic monotherapy by history:  No  Recommended Plan for Multiple Antipsychotic Therapies: NA     Medication List       Indication   divalproex 500 MG 24 hr tablet  Commonly known as:  DEPAKOTE ER  Take 1 tablet (500 mg total) by mouth at bedtime. For mood stabilization   Indication:  Manic Phase of Manic-Depression, Rapidly Alternating Manic-Depressive Psychosis, Mood instabilization     hydrOXYzine 50 MG tablet  Commonly known as:  ATARAX/VISTARIL  Take 1 tablet (50 mg total) by mouth at bedtime as needed (sleep). For sleep   Indication:  Anxiety associated with Organic Disease, Sleep     lisinopril 10 MG tablet  Commonly known as:  PRINIVIL,ZESTRIL  Take 1 tablet (10 mg total) by mouth daily. For high blood pressure control   Indication:  High Blood Pressure     multivitamin with minerals Tabs tablet  Take 1 tablet by mouth daily. For vitamin supplement   Indication:  Low vitamin     QUEtiapine 100 MG tablet  Commonly known as:  SEROQUEL  Take 1 tablet (100 mg total) by mouth at bedtime. For mood control   Indication:  Depressive Phase of Manic-Depression, Mood control       Follow-up Information   Follow up with [facility] On 04/03/2013. (Arrive at 8:00 am promptly for assessment and possible admission.  )  Contact information:   [address] [place], [place] [address] Phone: [phone] Fax: [phone]     Follow-up recommendations:  Activity:  As tolerated Diet: As recommended by your primary care doctor. Keep all scheduled follow-up appointments as recommended. Continue to work the relapse prevention plan Comments:  Take all your medications as prescribed by your mental healthcare provider. Report any adverse effects and or reactions from your  medicines to your outpatient provider promptly. Patient is instructed and cautioned to not engage in alcohol and or illegal drug use while on prescription medicines. In the event of worsening symptoms, patient is instructed to call the crisis hotline, 911 and or go to the nearest ED for appropriate evaluation and treatment of symptoms. Follow-up with your primary care provider for your other medical issues, concerns and or health care needs.   Total Discharge Time:  Greater than 30 minutes.  Signed: Sanjuana Kava, PMHNP-BC, FNP 04/03/2013, 4:34 PM Agree with assessment and plan Reymundo Poll. [place], [email or web address].D

## 2013-04-02 NOTE — BHH Group Notes (Signed)
Adult Psychoeducational Group Note  Date:  04/02/2013 Time:  10:41 AM  Group Topic/Focus:  Wellness Toolbox:   The focus of this group is to discuss various aspects of wellness, balancing those aspects and exploring ways to increase the ability to experience wellness.  Patients will create a wellness toolbox for use upon discharge.  Participation Level:  Active  Participation Quality:  Appropriate  Affect:  Appropriate  Cognitive:  Appropriate  Insight: Appropriate  Engagement in Group:  Engaged  Modes of Intervention:  Education, Exploration, Problem-solving and Support  Additional Comments:  Pt participated during the therapeutic activity and completed his wellness wheel word search.  Tania Ade 04/02/2013, 10:41 AM

## 2013-04-02 NOTE — Tx Team (Signed)
Interdisciplinary Treatment Plan Update (Adult)  Date: 04/02/2013  Time Reviewed:  9:45 AM  Progress in Treatment: Attending groups: Yes Participating in groups:  Yes Taking medication as prescribed:  Yes Tolerating medication:  Yes Family/Significant othe contact made: CSW assessing  Patient understands diagnosis:  Yes Discussing patient identified problems/goals with staff:  Yes Medical problems stabilized or resolved:  Yes Denies suicidal/homicidal ideation: Yes Issues/concerns per patient self-inventory:  Yes Other:  New problem(s) identified: N/A  Discharge Plan or Barriers: CSW assessing for appropriate referrals.  Reason for Continuation of Hospitalization: Anxiety Depression Medication Stabilization Substance Use  Comments: N/A  Estimated length of stay: 3-5 days  For review of initial/current patient goals, please see plan of care.  Attendees: Patient:     Family:     Physician:  Dr. Dub Mikes 04/02/2013 10:00 am  Nursing:   Neill Loft, RN 04/02/2013 10:00 am  Clinical Social Worker:  Reyes Ivan, LCSWA 04/02/2013 10:00 am  Other: Shari Heritage 04/02/2013 10:00 am  Other:  Onnie Boer, RN case manager 04/02/2013 10:00 am  Other:  Roswell Miners, RN 04/02/2013 10:00 am  Other:  Herbert Seta Smart, LCSWA 04/02/2013 10:00 am  Other:    Other:    Other:    Other:    Other:    Other:     Scribe for Treatment Team:   Carmina Miller, 04/02/2013 12:44 PM

## 2013-04-02 NOTE — Progress Notes (Signed)
Pt expressing worry and anxiety regarding what tomorrow will bring. He reports receiving phone call from boss today who is placing pressure on patient to return to work. Pt feeling torn between need for treatment and need for income. Supported and encouraged pt. Medicated per orders. Pt receptive to support. States he is trying to stay "in the moment." Denies SI/HI/AVh and remains safe. Lawrence Marseilles

## 2013-04-02 NOTE — [facility] Suicide Risk Assessment (Signed)
[facility] Adult Inpatient Family/Significant Other Suicide Prevention Education  Suicide Prevention Education:   Patient Refusal for Family/Significant Other Suicide Prevention Education: The patient has refused to provide written consent for family/significant other to be provided Family/Significant Other Suicide Prevention Education during admission and/or prior to discharge.  Physician notified.  CSW provided suicide prevention information with patient.    The suicide prevention education provided includes the following:  Suicide risk factors  Suicide prevention and interventions  National Suicide Hotline telephone number  [facility] assessment telephone number  [facility] Emergency Assistance 911  [facility] and/or Residential Mobile Crisis Unit telephone number   Eric Livingston, [place] 04/02/2013 2:36 PM

## 2013-04-02 NOTE — [facility] Suicide Risk Assessment (Signed)
Suicide Risk Assessment  Discharge Assessment     Demographic Factors:  Male  Mental Status Per Nursing Assessment::   On Admission:  NA  Current Mental Status by Physician: In full contact with reality. There are no suicidal ideas, plans or intent. His mood is euthymic,. His affect is appropriate. He is willing and motivated to pursue further rehab treatment at [facility]   Loss Factors: NA  Historical Factors: NA  Risk Reduction Factors:   Sense of responsibility to family  Continued Clinical Symptoms:  Alcohol/Substance Abuse/Dependencies  Cognitive Features That Contribute To Risk:  Thought constriction (tunnel vision)    Suicide Risk:  Minimal: No identifiable suicidal ideation.  Patients presenting with no risk factors but with morbid ruminations; may be classified as minimal risk based on the severity of the depressive symptoms  Discharge Diagnoses:   AXIS I:  Polysubstance Dependence, Substance Induced Mood Disorder  AXIS II:  Deferred AXIS III:   Past Medical History  Diagnosis Date  . Hypertension   . Alcohol abuse   . Depression    AXIS IV:  other psychosocial or environmental problems AXIS V:  61-70 mild symptoms  Plan Of Care/Follow-up recommendations:  Activity:  as tolerated Diet:  regular Follow Up at [facility] Is patient on multiple antipsychotic therapies at discharge:  No   Has Patient had three or more failed trials of antipsychotic monotherapy by history:  No  Recommended Plan for Multiple Antipsychotic Therapies: N/A   Anyi Fels A 04/02/2013, 3:31 PM

## 2013-04-02 NOTE — Progress Notes (Signed)
Adult Psychoeducational Group Note  Date:  04/02/2013 Time:  11:00AM Group Topic/Focus:  Wellness Toolbox:   The focus of this group is to discuss various aspects of wellness, balancing those aspects and exploring ways to increase the ability to experience wellness.  Patients will create a wellness toolbox for use upon discharge.  Participation Level:  Active  Participation Quality:  Appropriate and Attentive  Affect:  Appropriate  Cognitive:  Alert and Appropriate  Insight: Appropriate  Engagement in Group:  Engaged  Modes of Intervention:  Discussion  Additional Comments:  Pt. Was attentive and appropriate during today's group discussion. Pt was able to complete self care assessment out of today's handbook. Pt shared that he is going to better decision in life and that he is going to a day program. Pt. Stated that he is going to take it one day at a time and improve his life style and that mean to improve living arrangements and supports.   Eric Livingston D 04/02/2013, 3:33 PM

## 2013-04-02 NOTE — [facility] Group Notes (Signed)
[facility] LCSW Group Therapy  04/02/2013 3:37 PM  Type of Therapy:  Group Therapy  Participation Level:  Active  Participation Quality:  Monopolizing  Affect:  Angry, Defensive, Irritable and Labile  Cognitive:  Disorganized  Insight:  Limited  Engagement in Therapy:  Monopolizing  Modes of Intervention:  Discussion, Education, Exploration, Socialization and Support  Summary of Progress/Problems: Today's Topic: Overcoming Obstacles. Pt identified obstacles faced currently and processed barriers involved in overcoming these obstacles. Pt identified steps necessary for overcoming these obstacles and explored motivation (internal and external) for facing these difficulties head on. Pt further identified one area of concern in their lives and chose a skill of focus pulled from their "toolbox." Eric Livingston was first to speak in today's group and talked about obstacles surrounding the choices he has in terms of placement--[facility], o/p, or "f*&k it all and run." As he talked about his obstacle, Eric Livingston became increasingly angry, eventually stood up, stated that this group was not helpful to him, and walked out. Other group members became concerned. CSW asked RN to make sure pt was all right. Pt eventually returned to group and apologized for being rude/leaving. Eric Livingston explained that he has a hard time asking for help, feels ashamed that his life has "come to this," and feels that he does not have a support network. Eric Livingston was monopolizing of group time and had to be redirected frequently.    Eric Livingston, Eric Livingston 04/02/2013, 3:37 PM

## 2013-04-03 DIAGNOSIS — F1020 Alcohol dependence, uncomplicated: Secondary | ICD-10-CM

## 2013-04-03 DIAGNOSIS — F1994 Other psychoactive substance use, unspecified with psychoactive substance-induced mood disorder: Secondary | ICD-10-CM

## 2013-04-03 MED ORDER — ADULT MULTIVITAMIN W/MINERALS CH: 1.0000 | ORAL_TABLET | Freq: Every day | ORAL | Status: DC

## 2013-04-03 NOTE — Progress Notes (Signed)
Pt was discharged with follow up for [facility]. Pt acknowledged understanding discharge instructions. Encouraged pt to take care of himself. Pt stated "they have no choice, but to be lenient on me, because I came here. I want help". Pt stated he was "pissy drunk".  Because of pt's demeanor, writer asked pt if he was planning to go to [facility]. Pt looked at writer, shrugged his shoulder, and made a sound. Writer told pt that he could be honest because he was already discharged from [facility]. Pt stated, "nah, well yeah I'ma go but if I don't like it, I'ma leave". Pt stated he has "people in place". Pt denied SI, HI, A/V.

## 2013-04-03 NOTE — Progress Notes (Signed)
D: Patient in day room interacting with peers at the beginning of the shift. He reported having a good day and said he was ready for discharge in the morning. He denied SI/HI, denied withdrawal symptoms and denied hallucinations.although he had no discharge order and he said he had to leave in the morning or else he would loose his bed. A: Writer called Dr Dub Mikes; he said he had already completed the suicide risk assessment and asked writer to call Aggie,Pa  to complete her part of it. Writer called Aggie, PA  and  She completed the discharge ordered. Writer notified patient and reviewed discharge paperwork with patient. R: Patient receptive to encouragements and supports. He would be discharged as ordered in the morning. Q 15 minute check continues to maintain safety.

## 2013-04-03 NOTE — Progress Notes (Signed)
Note reviewed and agreed with  

## 2013-04-03 NOTE — Progress Notes (Addendum)
Patient Discharge Instructions:  After Visit Summary (AVS):   Faxed to:  04/03/13 Psychiatric Admission Assessment Note:   Faxed to:  04/03/13 Discharge Summary Note:   Faxed to:  04/06/13 Suicide Risk Assessment - Discharge Assessment:   Faxed to:  04/03/13 Faxed/Sent to the Next Level Care provider:  04/03/13 Faxed to [facility] @ [phone]  Jerelene Redden, 04/03/2013, 3:29 PM

## 2015-03-23 ENCOUNTER — Emergency Department (HOSPITAL_BASED_OUTPATIENT_CLINIC_OR_DEPARTMENT_OTHER)
Admission: EM | Admit: 2015-03-23 | Discharge: 2015-03-23 | Disposition: A | Discharge status: Home / Self Care | Payer: Medicaid Other | Attending: Emergency Medicine | Admitting: Emergency Medicine

## 2015-03-23 ENCOUNTER — Encounter (HOSPITAL_BASED_OUTPATIENT_CLINIC_OR_DEPARTMENT_OTHER): Payer: Self-pay | Admitting: Emergency Medicine

## 2015-03-23 DIAGNOSIS — I10 Essential (primary) hypertension: Secondary | ICD-10-CM

## 2015-03-23 DIAGNOSIS — Z8639 Personal history of other endocrine, nutritional and metabolic disease: Secondary | ICD-10-CM | POA: Insufficient documentation

## 2015-03-23 DIAGNOSIS — F329 Major depressive disorder, single episode, unspecified: Secondary | ICD-10-CM | POA: Insufficient documentation

## 2015-03-23 DIAGNOSIS — R112 Nausea with vomiting, unspecified: Secondary | ICD-10-CM | POA: Insufficient documentation

## 2015-03-23 DIAGNOSIS — R1084 Generalized abdominal pain: Secondary | ICD-10-CM | POA: Insufficient documentation

## 2015-03-23 DIAGNOSIS — Z79899 Other long term (current) drug therapy: Secondary | ICD-10-CM | POA: Insufficient documentation

## 2015-03-23 DIAGNOSIS — Z720 Tobacco use: Secondary | ICD-10-CM | POA: Insufficient documentation

## 2015-03-23 HISTORY — DX: Hyperlipidemia, unspecified: E78.5

## 2015-03-23 LAB — CBC WITH DIFFERENTIAL/PLATELET
BASOS ABS: 0 10*3/uL (ref 0.0–0.1)
Basophils Relative: 1 % (ref 0–1)
Eosinophils Absolute: 0.1 10*3/uL (ref 0.0–0.7)
Eosinophils Relative: 2 % (ref 0–5)
HEMATOCRIT: 43.5 % (ref 39.0–52.0)
Hemoglobin: 14.4 g/dL (ref 13.0–17.0)
LYMPHS ABS: 1.3 10*3/uL (ref 0.7–4.0)
LYMPHS PCT: 25 % (ref 12–46)
MCH: 28.6 pg (ref 26.0–34.0)
MCHC: 33.1 g/dL (ref 30.0–36.0)
MCV: 86.5 fL (ref 78.0–100.0)
MONOS PCT: 11 % (ref 3–12)
Monocytes Absolute: 0.6 10*3/uL (ref 0.1–1.0)
NEUTROS PCT: 61 % (ref 43–77)
Neutro Abs: 3.1 10*3/uL (ref 1.7–7.7)
PLATELETS: 264 10*3/uL (ref 150–400)
RBC: 5.03 MIL/uL (ref 4.22–5.81)
RDW: 14.7 % (ref 11.5–15.5)
WBC: 5.1 10*3/uL (ref 4.0–10.5)

## 2015-03-23 LAB — COMPREHENSIVE METABOLIC PANEL
ALK PHOS: 56 U/L (ref 38–126)
ALT: 32 U/L (ref 17–63)
ANION GAP: 10 (ref 5–15)
AST: 31 U/L (ref 15–41)
Albumin: 4.3 g/dL (ref 3.5–5.0)
BUN: 13 mg/dL (ref 6–20)
CALCIUM: 9.4 mg/dL (ref 8.9–10.3)
CO2: 26 mmol/L (ref 22–32)
CREATININE: 1.17 mg/dL (ref 0.61–1.24)
Chloride: 106 mmol/L (ref 101–111)
GFR calc Af Amer: >60 mL/min (ref >60)
GFR calc non Af Amer: >60 mL/min (ref >60)
Glucose, Bld: 101 mg/dL — ABNORMAL HIGH (ref 65–99)
POTASSIUM: 4.4 mmol/L (ref 3.5–5.1)
SODIUM: 142 mmol/L (ref 135–145)
TOTAL PROTEIN: 8.1 g/dL (ref 6.5–8.1)
Total Bilirubin: 0.7 mg/dL (ref 0.3–1.2)

## 2015-03-23 LAB — LIPASE, BLOOD: Lipase: 22 U/L (ref 22–51)

## 2015-03-23 MED ORDER — VERAPAMIL HCL 80 MG PO TABS: 80.0000 mg | ORAL_TABLET | Freq: Three times a day (TID) | ORAL | Status: DC

## 2015-03-23 MED ORDER — PROMETHAZINE HCL 25 MG PO TABS: 25.0000 mg | ORAL_TABLET | Freq: Four times a day (QID) | ORAL | Status: DC | PRN

## 2015-03-23 MED ORDER — FAMOTIDINE IN NACL 20-0.9 MG/50ML-% IV SOLN
20.0000 mg | Freq: Once | INTRAVENOUS | Status: AC
  Administered 2015-03-23: 20 mg via INTRAVENOUS
  Filled 2015-03-23: qty 50

## 2015-03-23 MED ORDER — VERAPAMIL HCL 120 MG PO TABS
120.0000 mg | ORAL_TABLET | Freq: Once | ORAL | Status: DC
  Filled 2015-03-23: qty 1

## 2015-03-23 MED ORDER — ONDANSETRON HCL 4 MG/2ML IJ SOLN
4.0000 mg | Freq: Once | INTRAMUSCULAR | Status: AC
  Administered 2015-03-23: 4 mg via INTRAVENOUS
  Filled 2015-03-23: qty 2

## 2015-03-23 MED ORDER — DICYCLOMINE HCL 10 MG PO CAPS
10.0000 mg | ORAL_CAPSULE | Freq: Once | ORAL | Status: AC
  Administered 2015-03-23: 10 mg via ORAL
  Filled 2015-03-23: qty 1

## 2015-03-23 MED ORDER — PROMETHAZINE HCL 25 MG/ML IJ SOLN
12.5000 mg | Freq: Once | INTRAMUSCULAR | Status: AC
  Administered 2015-03-23: 12.5 mg via INTRAVENOUS
  Filled 2015-03-23: qty 1

## 2015-03-23 MED ORDER — SODIUM CHLORIDE 0.9 % IV BOLUS (SEPSIS)
1000.0000 mL | Freq: Once | INTRAVENOUS | Status: AC
  Administered 2015-03-23: 1000 mL via INTRAVENOUS

## 2015-03-23 NOTE — ED Notes (Addendum)
Patient reports drinking last night.  Reports "a few drinks"-states he "had a lot" when asked for clarification.  Reports that he has been vomiting all day.  Reports trying to go to sleep and when he woke up still feeling nauseated and vomited as well as felt weak and had been fatigued.  Reports he has hypertension and hyperlipidemia and has been without meds for the past week.

## 2015-03-23 NOTE — ED Provider Notes (Signed)
CSN: [id number]     Arrival date & time 03/23/15  1715 History   First MD Initiated Contact with Patient 03/23/15 1734     Chief Complaint  Patient presents with  . Emesis     (Consider location/radiation/quality/duration/timing/severity/associated sxs/prior Treatment) HPI   Blood pressure 144/115, pulse 95, temperature 97.6 F (36.4 C), temperature source Axillary, resp. rate 20, height 6\' 3"  (1.905 m), weight 250 lb (113.399 kg), SpO2 98 %.  Eric Livingston is a 39 y.o. male complaining of 9 episodes of nonbloody, nonbilious, non-coffee ground emesis starting this morning after patient drank last night. Patient states that he does not drink daily, he does not believe he has an alcohol abuse problem. Been out of his hypertension, hyperlipidemia and "fluid pill" for 1 week after patient was discharged from jail. He has not established outpatient primary care. He states that he thinks he was taking verapamil 120 mg and unknown statin and diuretic. Patient denies fever, chills, chest pain, shortness of breath, headache, change in vision, cervicalgia, dysarthria, ataxia, change in bowel or bladder habits, history of liver dysfunction or varices.   Past Medical History  Diagnosis Date  . Hypertension   . Alcohol abuse   . Depression   . Hyperlipidemia    Past Surgical History  Procedure Laterality Date  . Hernia repair     History reviewed. No pertinent family history. History  Substance Use Topics  . Smoking status: Current Every Day Smoker -- 1.00 packs/day for 15 years    Types: Cigarettes  . Smokeless tobacco: Not on file  . Alcohol Use: Yes    Review of Systems  10 systems reviewed and found to be negative, except as noted in the HPI.   Allergies  Review of patient's allergies indicates no known allergies.  Home Medications   Prior to Admission medications   Medication Sig Start Date End Date Taking? Authorizing Provider  divalproex (DEPAKOTE ER) 500 MG 24 hr tablet  Take 1 tablet (500 mg total) by mouth at bedtime. For mood stabilization 04/02/13   Eric Kava, NP  hydrOXYzine (ATARAX/VISTARIL) 50 MG tablet Take 1 tablet (50 mg total) by mouth at bedtime as needed (sleep). For sleep 04/02/13   Eric Kava, NP  lisinopril (PRINIVIL,ZESTRIL) 10 MG tablet Take 1 tablet (10 mg total) by mouth daily. For high blood pressure control 04/02/13   Eric Kava, NP  Multiple Vitamin (MULTIVITAMIN WITH MINERALS) TABS tablet Take 1 tablet by mouth daily. For vitamin supplement 04/03/13   Eric Kava, NP  promethazine (PHENERGAN) 25 MG tablet Take 1 tablet (25 mg total) by mouth every 6 (six) hours as needed for nausea or vomiting. 03/23/15   Lalania Haseman, PA-C  QUEtiapine (SEROQUEL) 100 MG tablet Take 1 tablet (100 mg total) by mouth at bedtime. For mood control 04/02/13   Eric Kava, NP  verapamil (CALAN) 80 MG tablet Take 1 tablet (80 mg total) by mouth 3 (three) times daily. 03/23/15   Sareena Odeh, PA-C   BP 153/95 mmHg  Pulse 76  Temp(Src) 98.4 F (36.9 C) (Oral)  Resp 18  Ht 6\' 3"  (1.905 m)  Wt 250 lb (113.399 kg)  BMI 31.25 kg/m2  SpO2 97% Physical Exam  Constitutional: He is oriented to person, place, and time. He appears well-developed and well-nourished. No distress.  HENT:  Head: Normocephalic and atraumatic.  Dry mucous membranes  Eyes: Conjunctivae and EOM are normal. Pupils are equal, round, and reactive to light.  Neck:  Normal range of motion.  Cardiovascular: Normal rate, regular rhythm and intact distal pulses.   Pulmonary/Chest: Effort normal and breath sounds normal.  Abdominal: Soft. There is no tenderness.  Mild, diffuse tenderness to palpation with no guarding or rebound.  Murphy sign negative, no tenderness to palpation over McBurney's point, Rovsings, Psoas and obturator all negative.   Musculoskeletal: Normal range of motion.  Neurological: He is alert and oriented to person, place, and time.  Skin: He is not diaphoretic.   Psychiatric: He has a normal mood and affect.  Nursing note and vitals reviewed.   ED Course  Procedures (including critical care time) Labs Review Labs Reviewed  COMPREHENSIVE METABOLIC PANEL - Abnormal; Notable for the following:    Glucose, Bld 101 (*)    All other components within normal limits  CBC WITH DIFFERENTIAL/PLATELET  LIPASE, BLOOD    Imaging Review No results found.   EKG Interpretation None      MDM   Final diagnoses:  Essential hypertension, benign  Non-intractable vomiting with nausea, vomiting of unspecified type    Filed Vitals:   03/23/15 1719 03/23/15 1856  BP: 144/115 153/95  Pulse: 95 76  Temp: 97.6 F (36.4 C) 98.4 F (36.9 C)  TempSrc: Axillary Oral  Resp: 20 18  Height:  (1.905 m)   Weight: 250 lb (113.399 kg)   SpO2: 98% 97%    Medications  verapamil (CALAN) tablet 120 mg (120 mg Oral Not Given 03/23/15 1854)  sodium chloride 0.9 % bolus 1,000 mL (0 mLs Intravenous Stopped 03/23/15 1856)  ondansetron (ZOFRAN) injection 4 mg (4 mg Intravenous Given 03/23/15 1750)  famotidine (PEPCID) IVPB 20 mg premix (0 mg Intravenous Stopped 03/23/15 1856)  dicyclomine (BENTYL) capsule 10 mg (10 mg Oral Given 03/23/15 1854)  promethazine (PHENERGAN) injection 12.5 mg (12.5 mg Intravenous Given 03/23/15 1919)    Eric Livingston is a pleasant 39 y.o. male presenting with multiple episodes of nausea and vomiting after patient drank alcohol last night. No focal abdominal pain, vital signs significant for elevated blood pressure, states that he's been out of his BP medication for 1 week. He states he also takes a statin and a fluid pill but he doesn't know the name or dose. He is in the process of establishing primary care. No signs of endorgan damage.  Blood work reassuring repeat abdominal exam is benign. Patient reports improvement with fluids, he is tolerating by mouth.  Evaluation does not show pathology that would require ongoing emergent intervention  or inpatient treatment. Pt is hemodynamically stable and mentating appropriately. Discussed findings and plan with patient/guardian, who agrees with care plan. All questions answered. Return precautions discussed and outpatient follow up given.   New Prescriptions   PROMETHAZINE (PHENERGAN) 25 MG TABLET    Take 1 tablet (25 mg total) by mouth every 6 (six) hours as needed for nausea or vomiting.   VERAPAMIL (CALAN) 80 MG TABLET    Take 1 tablet (80 mg total) by mouth 3 (three) times daily.         Wynetta Emery, PA-C 03/23/15 1927  Benjiman Core, MD 03/26/15 [phone]

## 2015-03-23 NOTE — Discharge Instructions (Signed)
Do not hesitate to return to the emergency room for any new, worsening or concerning symptoms.  Please obtain primary care using resource guide below. Let them know that you were seen in the emergency room and that they will need to obtain records for further outpatient management.   Hypertension Hypertension is another name for high blood pressure. High blood pressure forces your heart to work harder to pump blood. A blood pressure reading has two numbers, which includes a higher number over a lower number (example: 110/72). HOME CARE   Have your blood pressure rechecked by your doctor.  Only take medicine as told by your doctor. Follow the directions carefully. The medicine does not work as well if you skip doses. Skipping doses also puts you at risk for problems.  Do not smoke.  Monitor your blood pressure at home as told by your doctor. GET HELP IF:  You think you are having a reaction to the medicine you are taking.  You have repeat headaches or feel dizzy.  You have puffiness (swelling) in your ankles.  You have trouble with your vision. GET HELP RIGHT AWAY IF:   You get a very bad headache and are confused.  You feel weak, numb, or faint.  You get chest or belly (abdominal) pain.  You throw up (vomit).  You cannot breathe very well. MAKE SURE YOU:   Understand these instructions.  Will watch your condition.  Will get help right away if you are not doing well or get worse. Document Released: 01/19/2008 Document Revised: 08/07/2013 Document Reviewed: 05/25/2013 [facility] Patient Information 2015 [place], [place]. This information is not intended to replace advice given to you by your health care provider. Make sure you discuss any questions you have with your health care provider.  Nausea, Adult Nausea means you feel sick to your stomach or need to throw up (vomit). It may be a sign of a more serious problem. If nausea gets worse, you may throw up. If you throw up a  lot, you may lose too much body fluid (dehydration). HOME CARE   Get plenty of rest.  Ask your doctor how to replace body fluid losses (rehydrate).  Eat small amounts of food. Sip liquids more often.  Take all medicines as told by your doctor. GET HELP RIGHT AWAY IF:  You have a fever.  You pass out (faint).  You keep throwing up or have blood in your throw up.  You are very weak, have dry lips or a dry mouth, or you are very thirsty (dehydrated).  You have dark or bloody poop (stool).  You have very bad chest or belly (abdominal) pain.  You do not get better after 2 days, or you get worse.  You have a headache. MAKE SURE YOU:  Understand these instructions.  Will watch your condition.  Will get help right away if you are not doing well or get worse. Document Released: 07/22/2011 Document Revised: 10/25/2011 Document Reviewed: 07/22/2011 [facility] Patient Information 2015 [place], [place]. This information is not intended to replace advice given to you by your health care provider. Make sure you discuss any questions you have with your health care provider.   Emergency Department Resource Guide 1) Find a Doctor and Pay Out of Pocket Although you won't have to find out who is covered by your insurance plan, it is a good idea to ask around and get recommendations. You will then need to call the office and see if the doctor you have chosen  will accept you as a new patient and what types of options they offer for patients who are self-pay. Some doctors offer discounts or will set up payment plans for their patients who do not have insurance, but you will need to ask so you aren't surprised when you get to your appointment.  2) Contact Your Local Health Department Not all health departments have doctors that can see patients for sick visits, but many do, so it is worth a call to see if yours does. If you don't know where your local health department is, you can check in your phone  book. The CDC also has a tool to help you locate your state's health department, and many state websites also have listings of all of their local health departments.  3) Find a Walk-in Clinic If your illness is not likely to be very severe or complicated, you may want to try a walk in clinic. These are popping up all over the country in pharmacies, drugstores, and shopping centers. They're usually staffed by nurse practitioners or physician assistants that have been trained to treat common illnesses and complaints. They're usually fairly quick and inexpensive. However, if you have serious medical issues or chronic medical problems, these are probably not your best option.  No Primary Care Doctor: - Call Health Connect at  [phone] - they can help you locate a primary care doctor that  accepts your insurance, provides certain services, etc. - Physician Referral Service- [phone]  Chronic Pain Problems: Organization         Address  Phone   Notes  [facility]  [phone] Patients need to be referred by their primary care doctor.   Medication Assistance: Organization         Address  Phone   Notes  [facility] Medication [facility] [address] [place] [place], [place] [address] [phone] --Must be a resident of [facility] -- Must have NO insurance coverage whatsoever (no Medicaid/ Medicare, etc.) -- The pt. MUST have a primary care doctor that directs their care regularly and follows them in the community   [facility]ist  [phone]   [employer]  [phone]    Agencies that provide inexpensive medical care: Organization         Address  Phone   Notes  [facility]  [phone]   [facility]    [phone]   [facility] [address] [place], [place] [address] [phone]   [facility] [address] [place] [place] [phone]   Planned  Parenthood    [phone]   [facility]    [phone]   Community [facility] and [facility]  [address], [place] Phone:  [phone], Fax:  [phone] Hours of Operation:  9 am - 6 pm, M-F.  Also accepts Medicaid/Medicare and self-pay.  [facility]  [address], [place] Phone: [phone], Fax: [phone]. Hours of Operation:  8:30 am - 5:30 pm, M-F.  Also accepts Medicaid and self-pay.  [facility] [place] [address], [place] Phone: [phone]   [facility] [address] Natasha Bence [place], [place] [phone], Ext. [phone] Mondays & Thursdays: 7-9 AM.  First 15 patients are seen on a first come, first serve basis.    Medicaid-accepting [facility] Providers:  Organization  Address  Phone   Notes  [facility] [address], [place] [phone]) [phone] Also accepts self-pay patients.  [facility] [address], [place]  [phone]   [facility] [address], [place] [phone]) [phone]   [facility] [address] [place] [address], [place] [phone]) [phone]   Renaye RakersVeita Bland [address], [place]   [phone]) [phone] Only accepts [place] Access [place] patients after they have their name applied to their card.   Self-Pay (no insurance) in [facility]:  Organization         Address  Phone   Notes  [facility]ick[facility] Patients, [facility] [address], [place] [phone]   [facility] [address], [place] [phone]   [facility]  [address], [place] [phone]   [facility]/Dr. Osei-Bonsu  [address] [place] [address], [place] or [phone], [place] [phone]) [phone] Phone number for both [place] and [place] locations is the same.    [facility] [address], [place] [phone]) [phone]   [facility] [address] [place] [address], [place] or [address] [phone]) [phone]   [facility] [address], [place] [phone](336) [phone], phone; [phone]) [phone], fax Sees patients 1st and 3rd Saturday of every month.  Must not qualify for public or private insurance (i.e. Medicaid, Medicare, [place] Health Choice, Veterans' Benefits)  Household income should be no more than 200% of the poverty level The clinic cannot treat you if you are pregnant or think you are pregnant  Sexually transmitted diseases are not treated at the clinic.    Dental Care: Organization         Address  Phone  Notes  [place] [facility] [address], [place] [phone] Accepts children up to age 40 who are enrolled in [place] or [place] Health Choice; pregnant women with a Medicaid card; and children who have applied for Medicaid or [place] Health Choice, but were declined, whose parents can pay a reduced fee at time of service.  [facility] [place]  [address], [place] [phone] Accepts children up to age 40 who are enrolled in [place] or [place] Health Choice; pregnant women with a Medicaid card; and children who have applied for Medicaid or  Health Choice, but were declined, whose parents can pay a reduced fee at time of service.  [facility]  [address], [place] [phone] Patients are seen by appointment only. Walk-ins are not accepted. [facility] will see patients 40 years of age and older. Monday - Tuesday (8am-5pm) Most Wednesdays (8:30-5pm) $30 per visit, cash only  [facility]  [address] [place] [address], [facility] [phone] Patients are seen by appointment only. Walk-ins are not accepted. [facility] will see patients 3018  years of age and older. One Wednesday Evening (Monthly: Volunteer Based).  $30 per visit, cash only  [facility]  [phone] for adults; Children under age 234, call Graduate Pediatric Dentistry at [phone]. Children aged 354-14, please call [phone] to request a pediatric application.  Dental services are provided in all areas of dental care  including fillings, crowns and bridges, complete and partial dentures, implants, gum treatment, root canals, and extractions. Preventive care is also provided. Treatment is provided to both adults and children. °Patients are selected via a lottery and there is often a waiting list. °  °[facility] [address], °[address] ° [phone] [email or web address] °  °[facility] [address], [place], [place] [phone], Ext. [phone] Second and Fourth Thursday of each month, opens at 6:30 AM; Clinic ends at 9 AM.  Patients are seen on a first-come first-served basis, and a limited number are seen during each clinic.  ° °[facility] ° [address], [place], [place] [phone]   Eligibility Requirements °You must have lived in [place], [place], or [place] counties for at least the last three months. °  You cannot be eligible for state or federal sponsored healthcare insurance, including Veterans Administration, Medicaid, or Medicare. °  You generally cannot be eligible for healthcare insurance through your employer.  °  How to apply: °Eligibility screenings are held every Tuesday and Wednesday afternoon from 1:00 pm until 4:00 pm. You do not need an appointment for the interview!  °[facility] [address], [place], [place] [phone]   °[facility]  [phone]   °[place]  [phone]   °[facility]  336-570-6415   ° °Behavioral Health Resources in the Community: °Intensive Outpatient  Programs °Organization         Address  Phone  Notes  °[place] [facility] [address], [place], [place] [phone]   °[facility] [address], [place], [place] [phone]   °[facility] [address], [place], [place] ° 336-882-2125   °[facility] [address],  °[place], [place] 1-800-853-5163 or 336-641-4981   °Substance Abuse Resources °Organization         Address  Phone  Notes  °[facility]  336-882-2125   °[facility]  336-784-9470   °[facility]  336-285-9073   °[facility]  336-845-3988   °Residential & Outpatient Substance Abuse Program  1-800-659-3381   °Psychological Services °Organization         Address  Phone  Notes  °[facility]  336- 832-9600   °[facility]  336- 378-7881   °[facility] [address], [place] 1-800-853-5163 or 336-641-4981   ° °Mobile Crisis Teams °Organization         Address  Phone  Notes  °[facility], Mobile Crisis Care Unit  1-877-626-1772   °Assertive °[facility]otherapeutic Services ° [address] [place], [place] 336-834-9664   °Sharon DeEsch [address] °[place] 336-554-5454   ° °Self-Help/Support Groups °Organization         Address  Phone             Notes  °[facility] [place] - variety of support groups  336- 373-1402 Call for more information  °Narcotics Anonymous (NA), [facility] [address], °[place]  2 meetings at this location  ° °Residential Treatment Programs °Organization         Address  Phone  Notes  °[facility] [address],    ° [place]  1-866-801-8205   °[facility] ° [address], [place],  [phone]   °[facility] [address],   [place] [phone] Admissions: 8am-3pm M-F  [facility] [address]    [place], [place]  [phone]   [facility] [address], [place], [place] [phone]   [facility] [address]  [place], [place] [phone]   [facility] - Intensive Outpatient [address], [place], [place] [phone]   [facility] ([facility] [address]  [place], [place] [phone] or [phone]   [facility] ([facility]) [address] [place], [place] [phone] Accepts Medicaid  [facility] [address]  [place] [phone] Substance Abuse/Addiction Treatment   [facility] Resources Organization         Address  Phone  Notes  [facility]  [phone]) [phone]   Angie FavaJulie Brannon, PhD [address], [address] [place], [place]   [phone]) [phone] or [phone](336) [phone]   [facility]   [address] [place], [place] [phone](336) [phone]   [facility] [address], [place], [place] [phone]) [phone] Insurance/Medicaid/sponsorship through [facility] [address]                                    [place], [place] [phone](336) [phone] Therapy/tele-psych/case  [facility] [address].   [place], [place] [phone]) [phone]    Dr. Lolly MustacheArfeen  [phone]) [phone]   [facility] [place]  [employer] [facility] 1) [address], [place] 2) [address], [place] 3)  [address], [place] [phone]) [phone]  [phone](336) [phone]   [facility] Child Abuse Hotline [phone](336) [phone] or [phone]) [phone] (After Hours)

## 2015-03-31 ENCOUNTER — Encounter (HOSPITAL_BASED_OUTPATIENT_CLINIC_OR_DEPARTMENT_OTHER): Payer: Self-pay | Admitting: *Deleted

## 2015-03-31 ENCOUNTER — Emergency Department (HOSPITAL_BASED_OUTPATIENT_CLINIC_OR_DEPARTMENT_OTHER)
Admission: EM | Admit: 2015-03-31 | Discharge: 2015-03-31 | Disposition: A | Discharge status: Home / Self Care | Payer: Medicaid Other | Attending: Emergency Medicine | Admitting: Emergency Medicine

## 2015-03-31 DIAGNOSIS — J029 Acute pharyngitis, unspecified: Secondary | ICD-10-CM | POA: Insufficient documentation

## 2015-03-31 DIAGNOSIS — M542 Cervicalgia: Secondary | ICD-10-CM | POA: Insufficient documentation

## 2015-03-31 DIAGNOSIS — Z8639 Personal history of other endocrine, nutritional and metabolic disease: Secondary | ICD-10-CM | POA: Insufficient documentation

## 2015-03-31 DIAGNOSIS — Z79899 Other long term (current) drug therapy: Secondary | ICD-10-CM | POA: Insufficient documentation

## 2015-03-31 DIAGNOSIS — I10 Essential (primary) hypertension: Secondary | ICD-10-CM | POA: Insufficient documentation

## 2015-03-31 DIAGNOSIS — F329 Major depressive disorder, single episode, unspecified: Secondary | ICD-10-CM | POA: Insufficient documentation

## 2015-03-31 DIAGNOSIS — M791 Myalgia: Secondary | ICD-10-CM | POA: Insufficient documentation

## 2015-03-31 DIAGNOSIS — Z720 Tobacco use: Secondary | ICD-10-CM | POA: Insufficient documentation

## 2015-03-31 LAB — RAPID STREP SCREEN (MED CTR MEBANE ONLY): STREPTOCOCCUS, GROUP A SCREEN (DIRECT): NEGATIVE

## 2015-03-31 MED ORDER — HYDROCODONE-ACETAMINOPHEN 5-325 MG PO TABS: 1.0000 | ORAL_TABLET | Freq: Four times a day (QID) | ORAL | Status: DC | PRN

## 2015-03-31 MED ORDER — HYDROCODONE-ACETAMINOPHEN 5-325 MG PO TABS
2.0000 | ORAL_TABLET | Freq: Once | ORAL | Status: AC
Start: 2015-03-31 — End: 2015-03-31
  Administered 2015-03-31: 2 via ORAL
  Filled 2015-03-31: qty 2

## 2015-03-31 MED ORDER — ACETAMINOPHEN 500 MG PO TABS
1000.0000 mg | ORAL_TABLET | Freq: Once | ORAL | Status: AC
  Administered 2015-03-31: 1000 mg via ORAL
  Filled 2015-03-31: qty 2

## 2015-03-31 NOTE — ED Notes (Signed)
Fever sore throat since last night. His wife states he got sick after eating the worm out of a tequilla bottle.

## 2015-03-31 NOTE — ED Provider Notes (Signed)
CSN: [id number]     Arrival date & time 03/31/15  1114 History   First MD Initiated Contact with Patient 03/31/15 1129     Chief Complaint  Patient presents with  . Fever     (Consider location/radiation/quality/duration/timing/severity/associated sxs/prior Treatment) HPI Complains of fever, chills and sore throat pain worse with swallowing onset last night nothing makes symptoms better. No cough no abdominal pain no nausea or vomiting . Other associated symptoms include diffuse myalgias. No other associated symptoms. Treated with aspirin 2 hours prior to coming here, without relief Past Medical History  Diagnosis Date  . Hypertension   . Alcohol abuse   . Depression   . Hyperlipidemia    Past Surgical History  Procedure Laterality Date  . Hernia repair     No family history on file. Social History  Substance Use Topics  . Smoking status: Current Every Day Smoker -- 1.00 packs/day for 15 years    Types: Cigarettes  . Smokeless tobacco: None  . Alcohol Use: Yes    Review of Systems  Constitutional: Positive for fever and chills.  HENT: Positive for sore throat.   Musculoskeletal: Positive for myalgias.  All other systems reviewed and are negative.     Allergies  Review of patient's allergies indicates no known allergies.  Home Medications   Prior to Admission medications   Medication Sig Start Date End Date Taking? Authorizing Provider  divalproex (DEPAKOTE ER) 500 MG 24 hr tablet Take 1 tablet (500 mg total) by mouth at bedtime. For mood stabilization 04/02/13   Sanjuana Kava, NP  hydrOXYzine (ATARAX/VISTARIL) 50 MG tablet Take 1 tablet (50 mg total) by mouth at bedtime as needed (sleep). For sleep 04/02/13   Sanjuana Kava, NP  lisinopril (PRINIVIL,ZESTRIL) 10 MG tablet Take 1 tablet (10 mg total) by mouth daily. For high blood pressure control 04/02/13   Sanjuana Kava, NP  Multiple Vitamin (MULTIVITAMIN WITH MINERALS) TABS tablet Take 1 tablet by mouth daily. For  vitamin supplement 04/03/13   Sanjuana Kava, NP  promethazine (PHENERGAN) 25 MG tablet Take 1 tablet (25 mg total) by mouth every 6 (six) hours as needed for nausea or vomiting. 03/23/15   Nicole Pisciotta, PA-C  QUEtiapine (SEROQUEL) 100 MG tablet Take 1 tablet (100 mg total) by mouth at bedtime. For mood control 04/02/13   Sanjuana Kava, NP  verapamil (CALAN) 80 MG tablet Take 1 tablet (80 mg total) by mouth 3 (three) times daily. 03/23/15   Nicole Pisciotta, PA-C   BP 148/87 mmHg  Pulse 118  Temp(Src) 101.7 F (38.7 C) (Oral)  Resp 20  Ht  (1.905 m)  Wt 250 lb (113.399 kg)  BMI 31.25 kg/m2  SpO2 99% Physical Exam  Constitutional: He appears well-developed and well-nourished. He appears distressed.  Alert appears uncomfortable handling secretions well. Nontoxic  HENT:  Head: Normocephalic and atraumatic.  Mouth/Throat: Oropharyngeal exudate present.  Uvula midline tonsillar exudate  Eyes: Conjunctivae are normal. Pupils are equal, round, and reactive to light.  Neck: Neck supple. No tracheal deviation present. No thyromegaly present.  Tender anterior cervical nodes  Cardiovascular: Normal rate and regular rhythm.   No murmur heard. Pulmonary/Chest: Effort normal and breath sounds normal.  Abdominal: Soft. Bowel sounds are normal. He exhibits no distension. There is no tenderness.  Musculoskeletal: Normal range of motion. He exhibits no edema or tenderness.  Lymphadenopathy:    He has cervical adenopathy.  Neurological: He is alert. Coordination normal.  Skin: Skin is  warm and dry. No rash noted.  Psychiatric: He has a normal mood and affect.  Nursing note and vitals reviewed.   ED Course  Procedures (including critical care time) Labs Review Labs Reviewed  RAPID STREP SCREEN (NOT AT [facility])    Imaging Review No results found. I, Doug Sou, personally reviewed and evaluated these images and lab results as part of my medical decision-making.   EKG  Interpretation None      MDM  Strep culture pending Plan prescription Norco or Tylenol for mild pain. Encourage oral liquids Follow-up with PMD or urgent care if not better by next week. Blood pressure recheck 3 weeks Diagnosis#1 exudative pharyngitis #2 elevated blood pressure Final diagnoses:  None        Doug Sou, MD 03/31/15 1206

## 2015-03-31 NOTE — ED Notes (Signed)
Pt reports taking 2  baby ASA prior to arrival.

## 2015-03-31 NOTE — Discharge Instructions (Signed)
Take Tylenol for mild pain or the pain medicine prescribed for bad pain. Don't take Tylenol together with the pain medicine prescribed as the combination can be harmful to your liver. Don't drink alcohol with the pain medicine prescribed as the pain medicine can be dangerous. See an urgent care center or any of the doctors on the resource guide if not better by next week. Drink at least  Six 8 ounce glasses of water or Gatorade each day . Your blood pressure is elevated today at 148/91. It should be rechecked within the next 3 weeks.  Emergency Department Resource Guide 1) Find a Doctor and Pay Out of Pocket Although you won't have to find out who is covered by your insurance plan, it is a good idea to ask around and get recommendations. You will then need to call the office and see if the doctor you have chosen will accept you as a new patient and what types of options they offer for patients who are self-pay. Some doctors offer discounts or will set up payment plans for their patients who do not have insurance, but you will need to ask so you aren't surprised when you get to your appointment.  2) Contact Your Local Health Department Not all health departments have doctors that can see patients for sick visits, but many do, so it is worth a call to see if yours does. If you don't know where your local health department is, you can check in your phone book. The CDC also has a tool to help you locate your state's health department, and many state websites also have listings of all of their local health departments.  3) Find a Walk-in Clinic If your illness is not likely to be very severe or complicated, you may want to try a walk in clinic. These are popping up all over the country in pharmacies, drugstores, and shopping centers. They're usually staffed by nurse practitioners or physician assistants that have been trained to treat common illnesses and complaints. They're usually fairly quick and  inexpensive. However, if you have serious medical issues or chronic medical problems, these are probably not your best option.  No Primary Care Doctor: - Call Health Connect at  [phone] - they can help you locate a primary care doctor that  accepts your insurance, provides certain services, etc. - Physician Referral Service- [phone]  Chronic Pain Problems: Organization         Address  Phone   Notes  [facility]  [phone] Patients need to be referred by their primary care doctor.   Medication Assistance: Organization         Address  Phone   Notes  [facility] Medication [facility] [address] [place] [place], [place] [address] [phone] --Must be a resident of [facility] -- Must have NO insurance coverage whatsoever (no Medicaid/ Medicare, etc.) -- The pt. MUST have a primary care doctor that directs their care regularly and follows them in the community   [facility]  [phone]   [employer]  [phone]    Agencies that provide inexpensive medical care: Organization         Address  Phone   Notes  [facility]  [phone]   [facility]    [phone]   [facility] [address] [place], [place] [address] [phone]   [facility] [address] [place] [place] (  [phone]   Planned Parenthood    [phone]   [facility]    [phone]   [address], [place] Phone:  [phone], Fax:  [phone] Hours of Operation:  9 am - 6 pm, M-F.  Also accepts Medicaid/Medicare and self-pay.  [facility] for [place], [address], [place] Phone: [phone], Fax: [phone]. Hours of Operation:  8:30 am - 5:30 pm, M-F.  Also accepts Medicaid and self-pay.  [facility] [address] [place] Phone: [phone]   [place], [place], [place] [phone], Ext. [phone] Mondays & Thursdays: 7-9 AM.  First 15 patients are seen on a first come, first serve basis.    [place] Providers:  Organization         Address  Phone   Notes  [facility] [address], [place] [phone] Also accepts self-pay patients.  [facility] [address], [place]  [phone]   [place], [address], [place] [phone]   [facility] [address], [place] [phone]   Lucianne Lei [address], [place]   [phone] Only accepts [place] Access [place] patients after they have their name applied to their card.   Self-Pay (no insurance) in [facility]:  Organization         Address  Phone   Notes  Sickle Cell Patients, [facility] Internal Medicine [place] [phone]   [facility] [place] [phone]   [facility] [place]  [place], [place], [place] [phone]   [facility]/Dr. Osei-Bonsu  [address], [place] or [address], [place] [phone] Phone number for both [place] and [place] locations is the same.  [facility] [address] [place] [phone]   [facility] [address], [place] or [address] [phone]   [facility] [address] [place] [phone], phone; [phone], fax Sees patients 1st and 3rd Saturday of every month.  Must not qualify for public or private insurance (i.e. Medicaid, Medicare, [place] Health Choice, Veterans' Benefits)  Household income should be no more than 200% of the poverty level The clinic cannot treat you if you are pregnant or  think you are pregnant  Sexually transmitted diseases are not treated at the clinic.    Dental Care: Organization         Address  Phone  Notes  [facility] of [facility] [place] [phone] Accepts children up to age 56 who are enrolled in [place] or [place]; pregnant women with a Medicaid card; and children who have applied for Medicaid or [place] Health Choice, but were declined, whose parents can pay a reduced fee at time of service.  [facility] Department of [facility]  [address], [place] [phone] Accepts children up to age 11 who are enrolled in [place] or [place]; pregnant women with a Medicaid card; and children who have applied for Medicaid or [place]  Choice, but were declined, whose parents can pay a reduced fee at time of service.  [place] [facility] Access PRO[facility]  [place] [phone] Patients are seen by appointment only. Walk-ins are not accepted. [place] will see patients 23 years of age and older. Monday - Tuesday (8am-5pm) Most Wednesdays (8:30-5pm) $30 per visit, cash only  [facility] Adult Dental Access PROGRAM  [address], [facility] [phone] Patients are seen by appointment only. Walk-ins are not accepted. [place] will see patients 90 years of age and older. One Wednesday Evening (Monthly: Volunteer Based).  $30 per visit, cash only  [place]  [phone] for adults; Children under age 64, call Graduate Pediatric Dentistry at [phone]. Children aged 69-14, please call [phone] to request a pediatric application.  Dental services are provided in all areas of dental care including fillings, crowns and bridges, complete and partial dentures, implants, gum treatment, root canals, and extractions. Preventive care is also provided. Treatment is provided to both adults  and children. Patients are selected via a lottery and there is often a waiting list.   [facility] [address] [place]  [phone] [email or web address]   [facility] [address] [place], [place] [phone], Ext. [phone] Second and Fourth Thursday of each month, opens at 6:30 AM; Clinic ends at 9 AM.  Patients are seen on a first-come first-served basis, and a limited number are seen during each clinic.   [facility]  [address] [place], [place] [phone]   Eligibility Requirements You must have lived in [place], [place], or [place] counties for at least the last three months.   You cannot be eligible for state or federal sponsored [employer], including [employer], [place], or [employer].   You generally cannot be eligible for healthcare insurance through your employer.    How to apply: Eligibility screenings are held every Tuesday and Wednesday afternoon from 1:00 pm until 4:00 pm. You do not need an appointment for the interview!  [facility] [address], [place], [place]   [place]  [place] Department  [place]  [phone]    Behavioral Health Resources in the Community: Intensive Outpatient Programs Organization         Address  Phone  Notes  Hecla Dougherty. [address], [place], [place] [phone]   [facility] Outpatient [address], [place], [place]   [facility]: [facility] [address] [place], [place]   [place] [address]  [name], [place] or [phone]   Substance Abuse Resources Organization         Address  Phone  Notes  [facility] Drug Services  [phone]   [place]  [phone]   [facility]     Chinita Pester  [phone]   Residential & Outpatient Substance Abuse Program  [phone]   Psychological Services Organization         Address  Phone  Notes  [facility]  [place]  [phone]   [place] [address] [place] or [phone]    Mobile Crisis Teams Organization         Address  Phone  Notes  [facility], Mobile Crisis Care Unit  [phone]   [facility]  [address] [place], [place]   [facility] [address], [place] [phone]    Self-Help/Support Groups Organization         Address  Phone             Notes  [facility] of [place] - variety of support groups  [place] Call for more information  Narcotics Anonymous (NA), [facility] [address], [employer] [place]  2 meetings at this location   Special educational needs teacher         Address  Phone  Notes  ASAP Residential Treatment [place],    [place]  [phone]   [facility]  [address], [place] [address], [place], [place]   [place], [place] [phone] Admissions: 8am-3pm M-F  Incentives Substance [place] [address],    [place], [place] [phone]   [facility] [address] [place], [place], [place]   [facility] [address],  [place], [place]   [facility] - Intensive Outpatient [address], [place], [place]   [facility] ([place] [address]  [place], [place] [phone] or [phone]   [facility] (RTS) [address] [place], [place] Accepts Medicaid  [facility] [place] [address]  [place] [phone] Substance Abuse/Addiction Treatment   [facility] Organization         Address  Phone  Notes  [facility]  [phone]   Domenic Schwab, PhD [address] [place]lis [place], [place]   [phone] or [phone]   [place], [place] [phone]   [facility] [address], [place], [place] [phone] Insurance/Medicaid/sponsorship through [facility] and Families [address] [place]                                    [place], [place] [phone] [place] [address], [place] [phone]    Dr. Adele Schilder  [phone]   [facility] Dept. 1) [address] [place] 2) [place] 3)  [address], [place] [phone]  [phone]   [place] [phone] or [phone] (After Hours)

## 2015-03-31 NOTE — ED Notes (Signed)
MD at bedside. 

## 2015-04-02 LAB — CULTURE, GROUP A STREP: Strep A Culture: NEGATIVE

## 2015-04-08 ENCOUNTER — Emergency Department (HOSPITAL_BASED_OUTPATIENT_CLINIC_OR_DEPARTMENT_OTHER): Payer: Medicaid Other

## 2015-04-08 ENCOUNTER — Emergency Department (HOSPITAL_BASED_OUTPATIENT_CLINIC_OR_DEPARTMENT_OTHER)
Admission: EM | Admit: 2015-04-08 | Discharge: 2015-04-08 | Disposition: A | Discharge status: Home / Self Care | Payer: Medicaid Other | Attending: Emergency Medicine | Admitting: Emergency Medicine

## 2015-04-08 ENCOUNTER — Encounter (HOSPITAL_BASED_OUTPATIENT_CLINIC_OR_DEPARTMENT_OTHER): Payer: Self-pay | Admitting: Emergency Medicine

## 2015-04-08 DIAGNOSIS — I10 Essential (primary) hypertension: Secondary | ICD-10-CM | POA: Insufficient documentation

## 2015-04-08 DIAGNOSIS — J020 Streptococcal pharyngitis: Secondary | ICD-10-CM | POA: Insufficient documentation

## 2015-04-08 DIAGNOSIS — R6883 Chills (without fever): Secondary | ICD-10-CM | POA: Insufficient documentation

## 2015-04-08 DIAGNOSIS — Z720 Tobacco use: Secondary | ICD-10-CM | POA: Insufficient documentation

## 2015-04-08 DIAGNOSIS — Z79899 Other long term (current) drug therapy: Secondary | ICD-10-CM | POA: Insufficient documentation

## 2015-04-08 DIAGNOSIS — H9201 Otalgia, right ear: Secondary | ICD-10-CM | POA: Insufficient documentation

## 2015-04-08 DIAGNOSIS — Z8639 Personal history of other endocrine, nutritional and metabolic disease: Secondary | ICD-10-CM | POA: Insufficient documentation

## 2015-04-08 DIAGNOSIS — M542 Cervicalgia: Secondary | ICD-10-CM | POA: Insufficient documentation

## 2015-04-08 DIAGNOSIS — R51 Headache: Secondary | ICD-10-CM | POA: Insufficient documentation

## 2015-04-08 DIAGNOSIS — J36 Peritonsillar abscess: Secondary | ICD-10-CM

## 2015-04-08 DIAGNOSIS — J029 Acute pharyngitis, unspecified: Secondary | ICD-10-CM

## 2015-04-08 DIAGNOSIS — F329 Major depressive disorder, single episode, unspecified: Secondary | ICD-10-CM | POA: Insufficient documentation

## 2015-04-08 LAB — CBC WITH DIFFERENTIAL/PLATELET
BAND NEUTROPHILS: 2 % (ref 0–10)
BASOS PCT: 0 % (ref 0–1)
Basophils Absolute: 0 10*3/uL (ref 0.0–0.1)
EOS ABS: 0.3 10*3/uL (ref 0.0–0.7)
Eosinophils Relative: 3 % (ref 0–5)
HCT: 40.6 % (ref 39.0–52.0)
HEMOGLOBIN: 13.1 g/dL (ref 13.0–17.0)
LYMPHS ABS: 1.9 10*3/uL (ref 0.7–4.0)
Lymphocytes Relative: 21 % (ref 12–46)
MCH: 28.7 pg (ref 26.0–34.0)
MCHC: 32.3 g/dL (ref 30.0–36.0)
MCV: 89 fL (ref 78.0–100.0)
Monocytes Absolute: 1 10*3/uL (ref 0.1–1.0)
Monocytes Relative: 11 % (ref 3–12)
NEUTROS ABS: 5.8 10*3/uL (ref 1.7–7.7)
Neutrophils Relative %: 63 % (ref 43–77)
PLATELETS: 393 10*3/uL (ref 150–400)
RBC: 4.56 MIL/uL (ref 4.22–5.81)
RDW: 15.6 % — AB (ref 11.5–15.5)
WBC: 9 10*3/uL (ref 4.0–10.5)

## 2015-04-08 LAB — BASIC METABOLIC PANEL
Anion gap: 8 (ref 5–15)
BUN: 14 mg/dL (ref 6–20)
CALCIUM: 9.3 mg/dL (ref 8.9–10.3)
CHLORIDE: 107 mmol/L (ref 101–111)
CO2: 26 mmol/L (ref 22–32)
CREATININE: 1.3 mg/dL — AB (ref 0.61–1.24)
GFR calc Af Amer: >60 mL/min (ref >60)
GFR calc non Af Amer: >60 mL/min (ref >60)
Glucose, Bld: 109 mg/dL — ABNORMAL HIGH (ref 65–99)
Potassium: 4.3 mmol/L (ref 3.5–5.1)
SODIUM: 141 mmol/L (ref 135–145)

## 2015-04-08 LAB — MONONUCLEOSIS SCREEN: MONO SCREEN: NEGATIVE

## 2015-04-08 LAB — RAPID STREP SCREEN (MED CTR MEBANE ONLY): STREPTOCOCCUS, GROUP A SCREEN (DIRECT): POSITIVE — AB

## 2015-04-08 MED ORDER — DEXAMETHASONE 1 MG PO TABS: 1.0000 mg | ORAL_TABLET | Freq: Two times a day (BID) | ORAL | Status: DC

## 2015-04-08 MED ORDER — ONDANSETRON HCL 4 MG/2ML IJ SOLN
4.0000 mg | Freq: Once | INTRAMUSCULAR | Status: AC
  Administered 2015-04-08: 4 mg via INTRAVENOUS
  Filled 2015-04-08: qty 2

## 2015-04-08 MED ORDER — SODIUM CHLORIDE 0.9 % IV BOLUS (SEPSIS)
1000.0000 mL | Freq: Once | INTRAVENOUS | Status: AC
  Administered 2015-04-08: 1000 mL via INTRAVENOUS

## 2015-04-08 MED ORDER — CLINDAMYCIN HCL 150 MG PO CAPS: 300.0000 mg | ORAL_CAPSULE | Freq: Four times a day (QID) | ORAL | Status: DC

## 2015-04-08 MED ORDER — CLINDAMYCIN PHOSPHATE 600 MG/50ML IV SOLN
600.0000 mg | Freq: Once | INTRAVENOUS | Status: AC
  Administered 2015-04-08: 600 mg via INTRAVENOUS
  Filled 2015-04-08: qty 50

## 2015-04-08 MED ORDER — HYDROMORPHONE HCL 1 MG/ML IJ SOLN
1.0000 mg | Freq: Once | INTRAMUSCULAR | Status: AC
Start: 2015-04-08 — End: 2015-04-08
  Administered 2015-04-08: 1 mg via INTRAVENOUS
  Filled 2015-04-08: qty 1

## 2015-04-08 MED ORDER — DEXAMETHASONE SODIUM PHOSPHATE 10 MG/ML IJ SOLN
10.0000 mg | Freq: Once | INTRAMUSCULAR | Status: AC
  Administered 2015-04-08: 10 mg via INTRAVENOUS
  Filled 2015-04-08: qty 1

## 2015-04-08 MED ORDER — OXYCODONE-ACETAMINOPHEN 5-325 MG PO TABS: 1.0000 | ORAL_TABLET | Freq: Four times a day (QID) | ORAL | Status: DC | PRN

## 2015-04-08 MED ORDER — SODIUM CHLORIDE 0.9 % IV BOLUS (SEPSIS)
1000.0000 mL | Freq: Once | INTRAVENOUS | Status: AC
Start: 2015-04-08 — End: 2015-04-08
  Administered 2015-04-08: 1000 mL via INTRAVENOUS

## 2015-04-08 MED ORDER — IOHEXOL 300 MG/ML  SOLN
75.0000 mL | Freq: Once | INTRAMUSCULAR | Status: AC | PRN
  Administered 2015-04-08: 75 mL via INTRAVENOUS

## 2015-04-08 MED ORDER — HYDROMORPHONE HCL 1 MG/ML IJ SOLN
1.0000 mg | Freq: Once | INTRAMUSCULAR | Status: AC
  Administered 2015-04-08: 1 mg via INTRAVENOUS

## 2015-04-08 MED ORDER — ONDANSETRON HCL 4 MG/2ML IJ SOLN
4.0000 mg | Freq: Once | INTRAMUSCULAR | Status: AC
  Administered 2015-04-08: 4 mg via INTRAVENOUS

## 2015-04-08 MED ORDER — HYDROMORPHONE HCL 1 MG/ML IJ SOLN
1.0000 mg | Freq: Once | INTRAMUSCULAR | Status: AC
  Administered 2015-04-08: 1 mg via INTRAVENOUS
  Filled 2015-04-08: qty 1

## 2015-04-08 NOTE — ED Notes (Signed)
Report given to Bobby RN. Assumed pt care at this time.  

## 2015-04-08 NOTE — ED Notes (Signed)
Pt states he has had a sore throat, ear pain and URI for 2 weeks just not getting better

## 2015-04-08 NOTE — Discharge Instructions (Signed)
Please read and follow all provided instructions.  Your diagnoses today include:  1. Tonsillar abscess   2. Pharyngitis   3. Streptococcal pharyngitis     Tests performed today include:  Strep test: was POSITIVE for strep throat  CT scan - shows a developing collection of pus in the right tonsil  Vital signs. See below for your results today.   Medications prescribed:   Vicodin (hydrocodone/acetaminophen) - narcotic pain medication  DO NOT drive or perform any activities that require you to be awake and alert because this medicine can make you drowsy. BE VERY CAREFUL not to take multiple medicines containing Tylenol (also called acetaminophen). Doing so can lead to an overdose which can damage your liver and cause liver failure and possibly death.   Clindamycin - antibiotic  You have been prescribed an antibiotic medicine: take the entire course of medicine even if you are feeling better. Stopping early can cause the antibiotic not to work.   Decadron - steroid medicine   It is best to take this medication in the morning to prevent sleeping problems. If you are diabetic, monitor your blood sugar closely and stop taking Prednisone if blood sugar is over 300. Take with food to prevent stomach upset.    Take any medications prescribed only as directed.   Home care instructions:  Please read the educational materials provided and follow any instructions contained in this packet.  Follow-up instructions: Call Dr. Thurmon Fair office tomorrow for an appointment tomorrow.   Return instructions:   Please return to the Emergency Department if you experience worsening symptoms.   Return if you have worsening problems swallowing, your neck becomes swollen, you cannot swallow your saliva or your voice becomes muffled.   Return with high persistent fever, persistent vomiting, or if you have trouble breathing.   Please return if you have any other emergent concerns.  Additional  Information:  Your vital signs today were: BP 144/88 mmHg   Pulse 81   Temp(Src) 98 F (36.7 C) (Oral)   Resp 16   Ht  (1.905 m)   Wt 250 lb (113.399 kg)   BMI 31.25 kg/m2   SpO2 100% If your blood pressure (BP) was elevated above 135/85 this visit, please have this repeated by your doctor within one month. --------------

## 2015-04-08 NOTE — ED Provider Notes (Signed)
CSN: [id number]     Arrival date & time 04/08/15  1714 History   First MD Initiated Contact with Patient 04/08/15 1730     Chief Complaint  Patient presents with  . URI     (Consider location/radiation/quality/duration/timing/severity/associated sxs/prior Treatment) HPI Comments: Patient presents with complaint of severe right-sided sore throat, right-sided ear pain, and upper respiratory symptoms including nasal congestion for the past 2 weeks. Patient was seen in emergency department on 8/15 and had a negative strep test. He was given conservative management including pain medication. Symptoms have worsened over the past several days. He is having difficulty swallowing due to the pain. He's had chills but no fever. Over-the-counter medications and rx Norco have not helped. He reports decreased oral intake due to pain. Pain is worse when he opens his mouth or moves his neck. The onset of this condition was acute. The course is gradually worsening.  The history is provided by the patient.    Past Medical History  Diagnosis Date  . Hypertension   . Alcohol abuse   . Depression   . Hyperlipidemia    Past Surgical History  Procedure Laterality Date  . Hernia repair     History reviewed. No pertinent family history. Social History  Substance Use Topics  . Smoking status: Current Every Day Smoker -- 1.00 packs/day for 15 years    Types: Cigarettes  . Smokeless tobacco: None  . Alcohol Use: Yes    Review of Systems  Constitutional: Positive for chills and fatigue. Negative for fever.  HENT: Positive for congestion, ear pain and sore throat. Negative for rhinorrhea and sinus pressure.   Eyes: Negative for redness.  Respiratory: Negative for cough and wheezing.   Gastrointestinal: Negative for nausea, vomiting, abdominal pain and diarrhea.  Genitourinary: Negative for dysuria.  Musculoskeletal: Positive for neck pain. Negative for myalgias and neck stiffness.  Skin: Negative for  rash.  Neurological: Positive for headaches.  Hematological: Positive for adenopathy.     Allergies  Review of patient's allergies indicates no known allergies.  Home Medications   Prior to Admission medications   Medication Sig Start Date End Date Taking? Authorizing Provider  divalproex (DEPAKOTE ER) 500 MG 24 hr tablet Take 1 tablet (500 mg total) by mouth at bedtime. For mood stabilization 04/02/13   Sanjuana Kava, NP  HYDROcodone-acetaminophen (NORCO) 5-325 MG per tablet Take 1-2 tablets by mouth every 6 (six) hours as needed for severe pain. 03/31/15   Doug Sou, MD  hydrOXYzine (ATARAX/VISTARIL) 50 MG tablet Take 1 tablet (50 mg total) by mouth at bedtime as needed (sleep). For sleep 04/02/13   Sanjuana Kava, NP  lisinopril (PRINIVIL,ZESTRIL) 10 MG tablet Take 1 tablet (10 mg total) by mouth daily. For high blood pressure control 04/02/13   Sanjuana Kava, NP  Multiple Vitamin (MULTIVITAMIN WITH MINERALS) TABS tablet Take 1 tablet by mouth daily. For vitamin supplement 04/03/13   Sanjuana Kava, NP  promethazine (PHENERGAN) 25 MG tablet Take 1 tablet (25 mg total) by mouth every 6 (six) hours as needed for nausea or vomiting. 03/23/15   Nicole Pisciotta, PA-C  QUEtiapine (SEROQUEL) 100 MG tablet Take 1 tablet (100 mg total) by mouth at bedtime. For mood control 04/02/13   Sanjuana Kava, NP  verapamil (CALAN) 80 MG tablet Take 1 tablet (80 mg total) by mouth 3 (three) times daily. 03/23/15   Nicole Pisciotta, PA-C   BP 158/103 mmHg  Pulse 84  Temp(Src) 98.4 F (36.9 C) (  Oral)  Resp 18  Ht 6\' 3"  (1.905 m)  Wt 250 lb (113.399 kg)  BMI 31.25 kg/m2  SpO2 100%   Physical Exam  Constitutional: He appears well-developed and well-nourished. He appears distressed (Patient uncomfortable).  HENT:  Head: Normocephalic and atraumatic.  Right Ear: Tympanic membrane, external ear and ear canal normal.  Left Ear: Tympanic membrane, external ear and ear canal normal.  Nose: Nose normal. No  mucosal edema or rhinorrhea.  Mouth/Throat: Uvula is midline and mucous membranes are normal. Mucous membranes are not dry. There is trismus in the jaw. No uvula swelling. Oropharyngeal exudate, posterior oropharyngeal edema (L palatine tonsil) and posterior oropharyngeal erythema present. No tonsillar abscesses.  No gross abscess or peritonsillar abscess on exam. R palatine tonsil is enlarged compared to the L. No uvular deviation or abscess. Voice is muffled.   Eyes: Conjunctivae are normal. Right eye exhibits no discharge. Left eye exhibits no discharge.  Neck: Normal range of motion. Neck supple.  Cardiovascular: Normal rate, regular rhythm and normal heart sounds.   Pulmonary/Chest: Effort normal and breath sounds normal. No respiratory distress. He has no wheezes. He has no rales.  Abdominal: Soft. There is no tenderness.  Lymphadenopathy:    He has cervical adenopathy (tender, right sided).  Neurological: He is alert.  Skin: Skin is warm and dry.  Psychiatric: He has a normal mood and affect.  Nursing note and vitals reviewed.   ED Course  Procedures (including critical care time) Labs Review Labs Reviewed  RAPID STREP SCREEN (NOT AT [facility]) - Abnormal; Notable for the following:    Streptococcus, Group A Screen (Direct) POSITIVE (*)    All other components within normal limits  CBC WITH DIFFERENTIAL/PLATELET - Abnormal; Notable for the following:    RDW 15.6 (*)    All other components within normal limits  BASIC METABOLIC PANEL - Abnormal; Notable for the following:    Glucose, Bld 109 (*)    Creatinine, Ser 1.30 (*)    All other components within normal limits  MONONUCLEOSIS SCREEN    Imaging Review Ct Soft Tissue Neck W Contrast  04/08/2015   CLINICAL DATA:  hard knot area by pt, rt sided sore throat, rt ear pain all x 2 weeks, URI x 2 week as well, per pt all symptoms have gotten worse since they started, HTN, alcohol abuse, hyperlipidemia,  EXAM: CT NECK WITH CONTRAST   TECHNIQUE: Multidetector CT imaging of the neck was performed using the standard protocol following the bolus administration of intravenous contrast.  CONTRAST:  75mL OMNIPAQUE IOHEXOL 300 MG/ML  SOLN  COMPARISON:  None.  FINDINGS: Pharynx and larynx: There is asymmetric enlargement of the palatine tonsils, right greater than left. There is an ill-defined focal area of lower attenuation within the right palatine tonsil measuring 2.2 x 1.0 x 2.0 cm suggesting early tonsillar abscess. The enlarged tonsils partly efface the parapharyngeal space fat pads, right greater than left. The oral airway is mildly narrowed and mildly deviated to the left. There is also prominence of the posterior nasopharyngeal soft tissues consistent with lymphoid hyperplasia. Nasopharyngeal airway is mildly narrowed. The laryngeal airway is widely patent as is the visualized trachea. No abnormality of the larynx.  Salivary glands: Unremarkable.  Thyroid: Normal.  Lymph nodes: Multiple prominent and enlarged lymph nodes. On the right, largest node is a level 2 jugulodigastric node measuring 16.5 mm in short axis. There is a 14.7 mm short axis node in this location on the left. Prominent and enlarged  lymph nodes extend along the very jugular chains, right greater than left.  Vascular: Unremarkable.  Limited intracranial: Unremarkable.  Visualized orbits: Minimally visualized but otherwise unremarkable.  Mastoids and visualized paranasal sinuses: Clear.  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 Skeletal structures are unremarkable.  Upper chest: No masses or adenopathy.  Clear upper lungs.  IMPRESSION: 1. Enlarged palatine tonsils, right greater than left. Prominent posterior nasopharyngeal lymphoid tissue. 2. Evidence of an early right palatine tonsillar abscess measuring 2 x 2 x 1.0 x 2.0 cm. 3. Multiple enlarged lymph nodes, right greater than left, all presumed reactive due to the  tonsillar infection. 4. No other acute findings. 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   Electronically Signed   By: Amie Portland M.D.   On: 04/08/2015 18:53   I have personally reviewed and evaluated these images and lab results as part of my medical decision-making.   EKG Interpretation None       6:05 PM Patient seen and examined. Work-up initiated. Medications ordered.   Vital signs reviewed and are as follows: BP 158/103 mmHg  Pulse 84  Temp(Src) 98.4 F (36.9 C) (Oral)  Resp 18  Ht 6\' 3"  (1.905 m)  Wt 250 lb (113.399 kg)  BMI 31.25 kg/m2  SpO2 100%  7:34 PM Patient discussed with Dr. Blinda Leatherwood.   I spoke with Dr. Annalee Genta of ENT. Reviewed plan. He asked me to discharge home with pain medication and Decadron prescription. They will follow-up in office tomorrow. Patient is to call after 9 AM for an appointment time.  9:04 PM 2L NS given. Patient and family updated. They will see ENT. Encourage patient to return with worsening severe symptoms, trouble breathing, high persistent fever, or other concerns. Patient verbalizes understanding recent.   MDM   Final diagnoses:  Pharyngitis  Tonsillar abscess  Streptococcal pharyngitis   Patient with severe pharyngitis with associated developing tonsillar abscess. No stridor. No deep space neck infection seen. Patient treated in the ED with fluids, clindamycin, Decadron. ENT follow-up arranged. Patient is uncomfortable appears nontoxic. No indications for admission at this time.    Renne Crigler, PA-C 04/08/15 2106  Gilda Crease, MD 04/09/15 [phone]

## 2016-07-01 IMAGING — CT CT NECK W/ CM
4 of 5 series · 15 of 33 positions shown, 17 images · IV contrast (omnipaque)
Comparison: None.

CLINICAL DATA: hard knot area by pt, rt sided sore throat, rt ear
pain all x 2 weeks, URI x 2 week as well, per pt all symptoms have
gotten worse since they started, HTN, alcohol abuse, hyperlipidemia,

EXAM:
CT NECK WITH CONTRAST
TECHNIQUE: Multidetector CT imaging of the neck was performed using the
standard protocol following the bolus administration of intravenous
contrast.
CONTRAST:  75mL OMNIPAQUE IOHEXOL 300 MG/ML  SOLN

[Series 2: neck 2.0 b31s · axial · 0.47mm/px · z∈[-254,-98]mm · 4 of 132 slices shown, 5 images]
[im 27/132  soft-tissue]
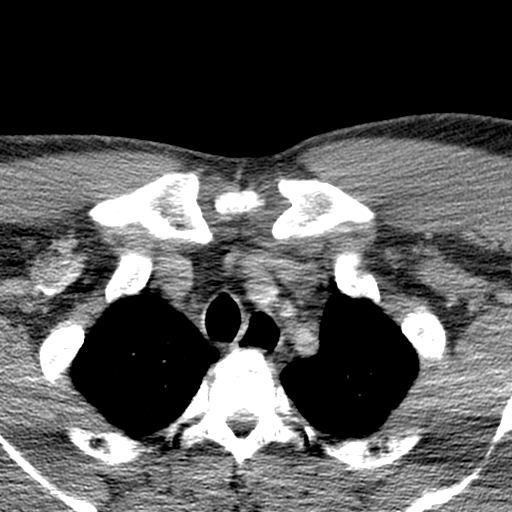
[im 27/132  bone]
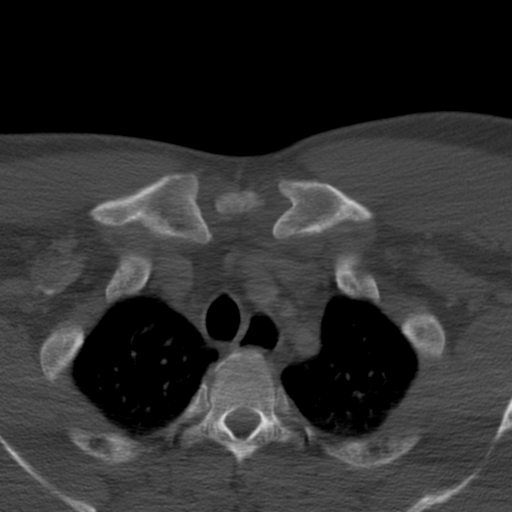
[im 53/132  bone]
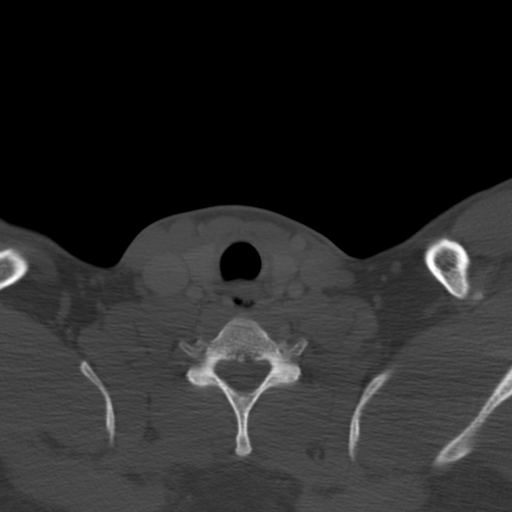
[im 79/132  bone]
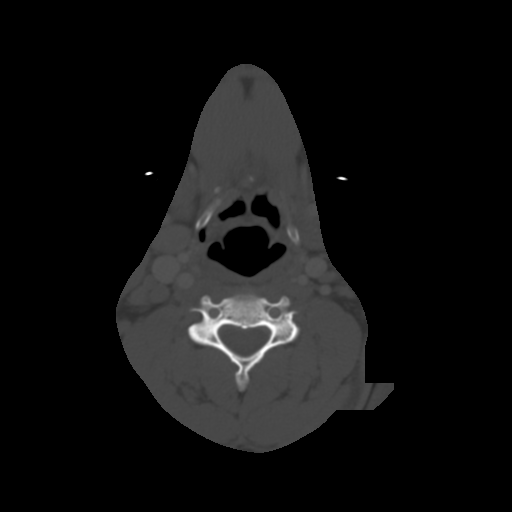
[im 105/132  bone]
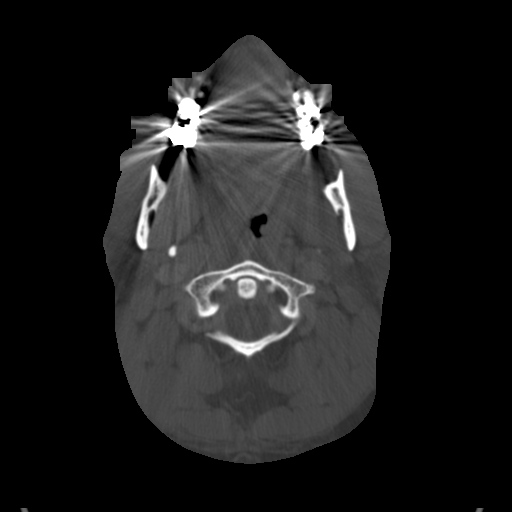

[Series 4: neck 2.0 coronal · coronal · 0.44mm/px · 3 of 122 slices shown]
[im 25/122  bone]
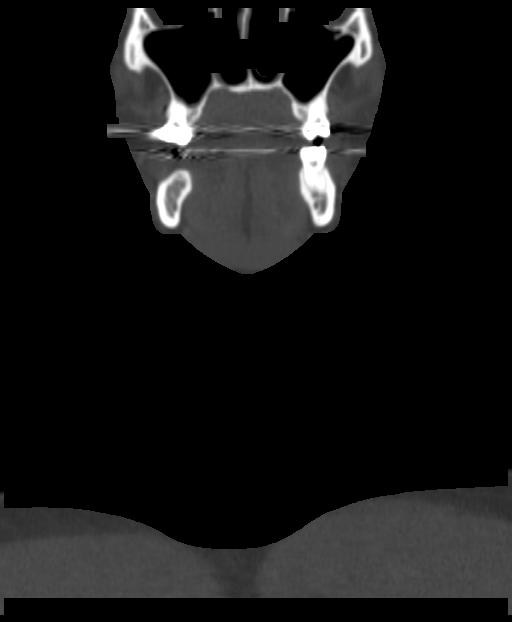
[im 49/122  bone]
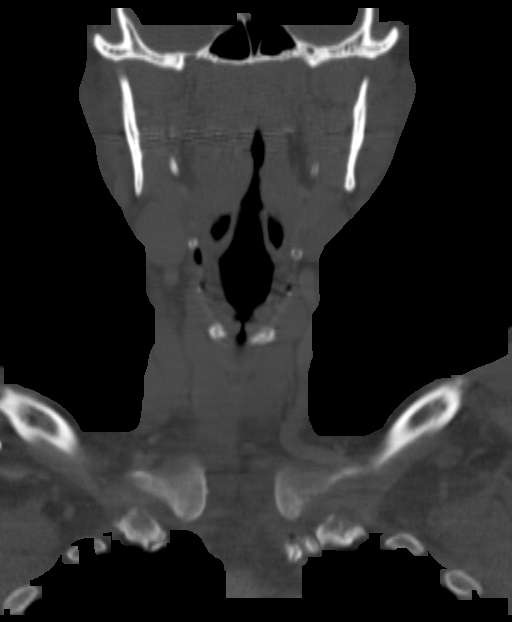
[im 73/122  bone]
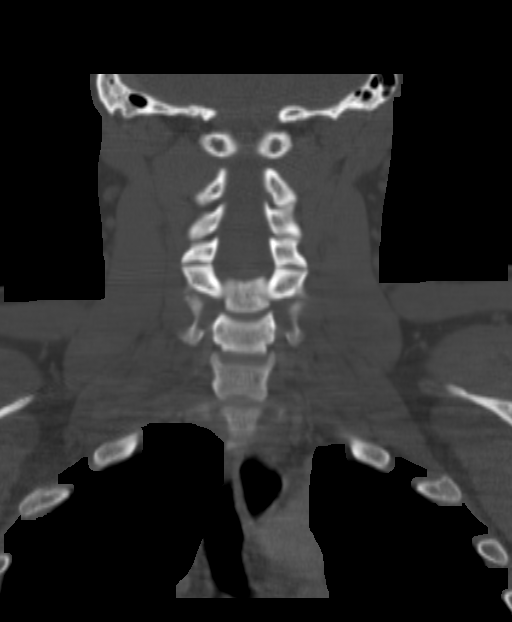

[Series 5: neck 2.0 sagittal · sagittal · 0.48mm/px · 5 of 126 slices shown, 6 images]
[im 42/126  bone]
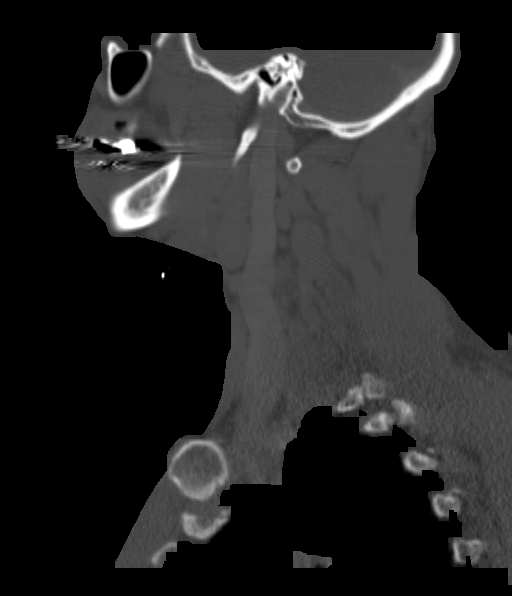
[im 53/126  bone]
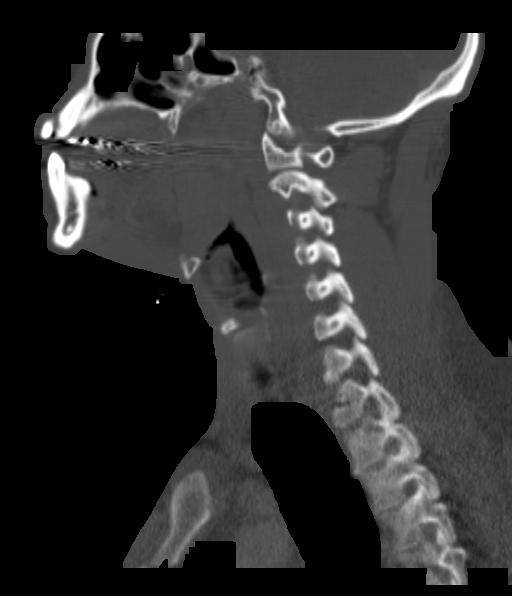
[im 63/126  soft-tissue]
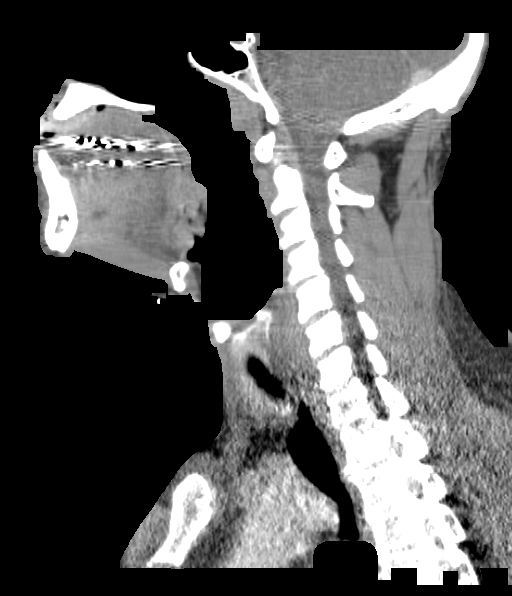
[im 63/126  bone]
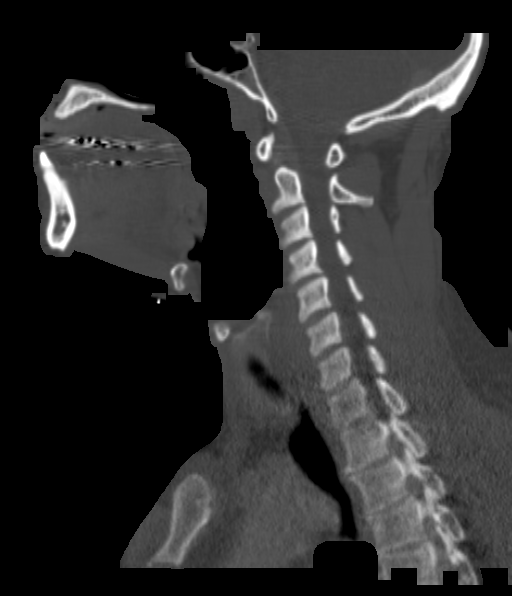
[im 73/126  bone]
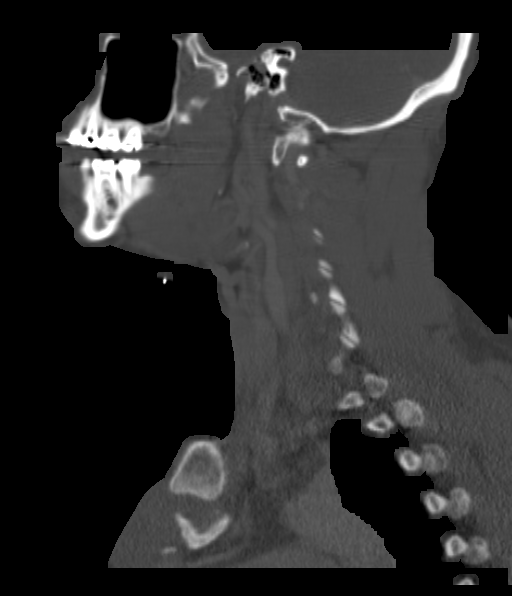
[im 84/126  bone]
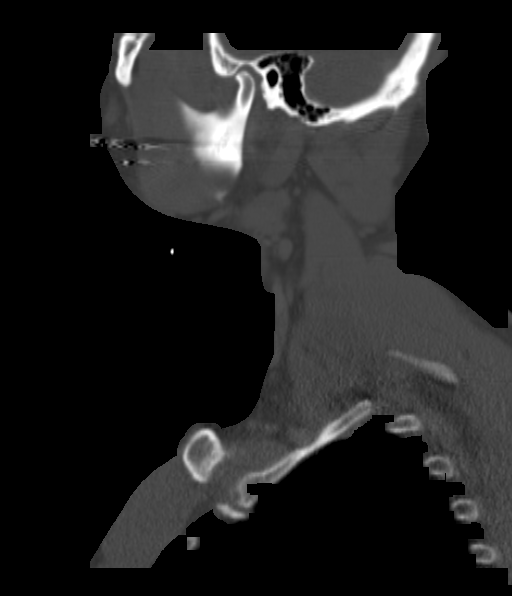

[Series 7: neck 2.0 orth axial to hyoid · axial · 0.40mm/px · z∈[-290,-206]mm · 3 of 131 slices shown]
[im 22/131  bone]
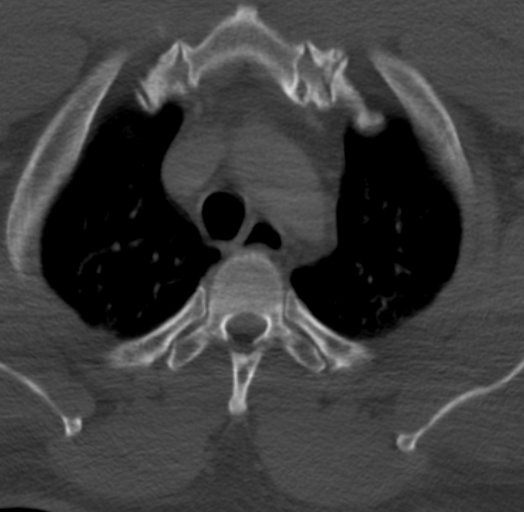
[im 44/131  bone]
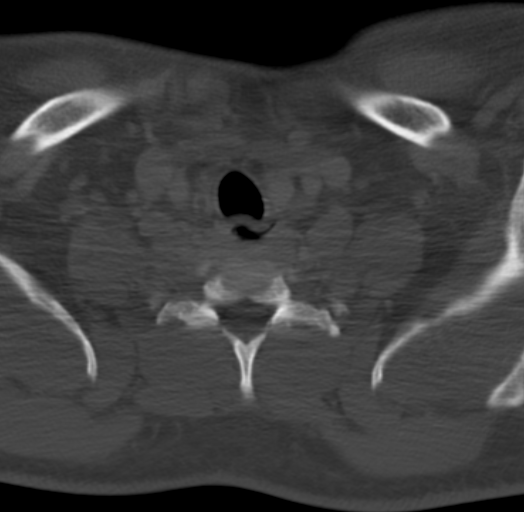
[im 66/131  bone]
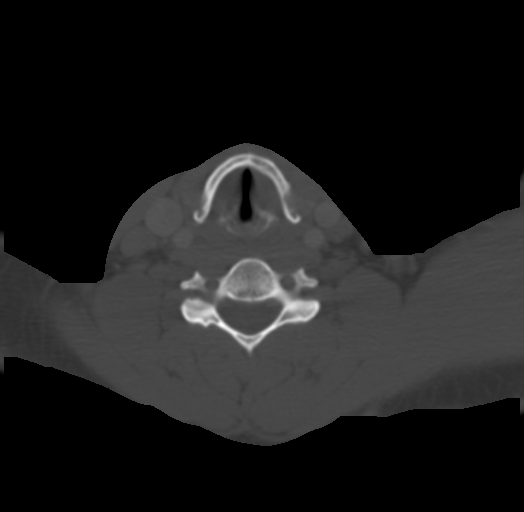

[15 of 33 positions shown; findings below may reference images not displayed]

FINDINGS: Pharynx and larynx: There is asymmetric enlargement of the palatine
tonsils, right greater than left. There is an ill-defined focal area
of lower attenuation within the right palatine tonsil measuring
x 1.0 x 2.0 cm suggesting early tonsillar abscess. The enlarged
tonsils partly efface the parapharyngeal space fat pads, right
greater than left. The oral airway is mildly narrowed and mildly
deviated to the left. There is also prominence of the posterior
nasopharyngeal soft tissues consistent with lymphoid hyperplasia.
Nasopharyngeal airway is mildly narrowed. The laryngeal airway is
widely patent as is the visualized trachea. No abnormality of the
larynx.

Salivary glands: Unremarkable.

Thyroid: Normal.

Lymph nodes: Multiple prominent and enlarged lymph nodes. On the
right, largest node is a level 2 jugulodigastric node measuring
mm in short axis. There is a 14.7 mm short axis node in this
location on the left. Prominent and enlarged lymph nodes extend
along the very jugular chains, right greater than left.

Vascular: Unremarkable.

Limited intracranial: Unremarkable.

Visualized orbits: Minimally visualized but otherwise unremarkable.

Mastoids and visualized paranasal sinuses: Clear.

Musculoskeletal: Absent left sternocleidomastoid muscle, which is
presumed to be congenitally absent. Skeletal structures are
unremarkable.

Upper chest: No masses or adenopathy.  Clear upper lungs.
IMPRESSION: 1. Enlarged palatine tonsils, right greater than left. Prominent
posterior nasopharyngeal lymphoid tissue.
2. Evidence of an early right palatine tonsillar abscess measuring 2
x 2 x 1.0 x 2.0 cm.
3. Multiple enlarged lymph nodes, right greater than left, all
presumed reactive due to the tonsillar infection.
4. No other acute findings.
5. Congenital absence of the left sternocleidomastoid muscle.

## 2016-11-06 ENCOUNTER — Encounter (HOSPITAL_COMMUNITY): Payer: Self-pay | Admitting: Emergency Medicine

## 2016-11-06 ENCOUNTER — Emergency Department (HOSPITAL_COMMUNITY)
Admission: EM | Admit: 2016-11-06 | Discharge: 2016-11-08 | Disposition: A | Discharge status: Home / Self Care | Payer: Medicaid Other | Attending: Emergency Medicine | Admitting: Emergency Medicine

## 2016-11-06 DIAGNOSIS — I10 Essential (primary) hypertension: Secondary | ICD-10-CM | POA: Insufficient documentation

## 2016-11-06 DIAGNOSIS — F329 Major depressive disorder, single episode, unspecified: Secondary | ICD-10-CM | POA: Diagnosis not present

## 2016-11-06 DIAGNOSIS — F17210 Nicotine dependence, cigarettes, uncomplicated: Secondary | ICD-10-CM | POA: Insufficient documentation

## 2016-11-06 DIAGNOSIS — F1920 Other psychoactive substance dependence, uncomplicated: Secondary | ICD-10-CM | POA: Diagnosis present

## 2016-11-06 DIAGNOSIS — R45851 Suicidal ideations: Secondary | ICD-10-CM | POA: Diagnosis present

## 2016-11-06 DIAGNOSIS — R45850 Homicidal ideations: Secondary | ICD-10-CM

## 2016-11-06 DIAGNOSIS — Z79899 Other long term (current) drug therapy: Secondary | ICD-10-CM | POA: Insufficient documentation

## 2016-11-06 LAB — COMPREHENSIVE METABOLIC PANEL
ALBUMIN: 4 g/dL (ref 3.5–5.0)
ALK PHOS: 68 U/L (ref 38–126)
ALT: 31 U/L (ref 17–63)
AST: 33 U/L (ref 15–41)
Anion gap: 10 (ref 5–15)
BILIRUBIN TOTAL: 0.7 mg/dL (ref 0.3–1.2)
BUN: 9 mg/dL (ref 6–20)
CALCIUM: 9.1 mg/dL (ref 8.9–10.3)
CO2: 26 mmol/L (ref 22–32)
Chloride: 104 mmol/L (ref 101–111)
Creatinine, Ser: 1.48 mg/dL — ABNORMAL HIGH (ref 0.61–1.24)
GFR calc Af Amer: >60 mL/min (ref >60)
GFR calc non Af Amer: 57 mL/min — ABNORMAL LOW (ref >60)
GLUCOSE: 113 mg/dL — AB (ref 65–99)
Potassium: 3.7 mmol/L (ref 3.5–5.1)
Sodium: 140 mmol/L (ref 135–145)
TOTAL PROTEIN: 7.3 g/dL (ref 6.5–8.1)

## 2016-11-06 LAB — RAPID URINE DRUG SCREEN, HOSP PERFORMED
AMPHETAMINES: POSITIVE — AB
BARBITURATES: NOT DETECTED
Benzodiazepines: NOT DETECTED
Cocaine: POSITIVE — AB
OPIATES: NOT DETECTED
TETRAHYDROCANNABINOL: POSITIVE — AB

## 2016-11-06 LAB — CBC
HEMATOCRIT: 44.5 % (ref 39.0–52.0)
Hemoglobin: 14.8 g/dL (ref 13.0–17.0)
MCH: 30.3 pg (ref 26.0–34.0)
MCHC: 33.3 g/dL (ref 30.0–36.0)
MCV: 91 fL (ref 78.0–100.0)
Platelets: 324 10*3/uL (ref 150–400)
RBC: 4.89 MIL/uL (ref 4.22–5.81)
RDW: 13.4 % (ref 11.5–15.5)
WBC: 7 10*3/uL (ref 4.0–10.5)

## 2016-11-06 LAB — SALICYLATE LEVEL: Salicylate Lvl: <7 mg/dL (ref 2.8–30.0)

## 2016-11-06 LAB — ETHANOL: Alcohol, Ethyl (B): 182 mg/dL — ABNORMAL HIGH (ref <5)

## 2016-11-06 LAB — ACETAMINOPHEN LEVEL: Acetaminophen (Tylenol), Serum: <10 ug/mL — ABNORMAL LOW (ref 10–30)

## 2016-11-06 MED ORDER — VERAPAMIL HCL 80 MG PO TABS
80.0000 mg | ORAL_TABLET | Freq: Three times a day (TID) | ORAL | Status: DC
  Administered 2016-11-06 – 2016-11-08 (×5): 80 mg via ORAL
  Filled 2016-11-06 (×7): qty 1

## 2016-11-06 MED ORDER — RISPERIDONE 2 MG PO TABS
2.0000 mg | ORAL_TABLET | Freq: Every day | ORAL | Status: DC
  Administered 2016-11-06 – 2016-11-08 (×3): 2 mg via ORAL
  Filled 2016-11-06 (×4): qty 1

## 2016-11-06 MED ORDER — NICOTINE 21 MG/24HR TD PT24
21.0000 mg | MEDICATED_PATCH | Freq: Every day | TRANSDERMAL | Status: DC
  Administered 2016-11-06 – 2016-11-08 (×3): 21 mg via TRANSDERMAL
  Filled 2016-11-06 (×3): qty 1

## 2016-11-06 MED ORDER — LORAZEPAM 1 MG PO TABS
0.0000 mg | ORAL_TABLET | Freq: Four times a day (QID) | ORAL | Status: DC
Start: 2016-11-06 — End: 2016-11-08

## 2016-11-06 MED ORDER — ACETAMINOPHEN 325 MG PO TABS: 650.0000 mg | ORAL_TABLET | ORAL | Status: DC | PRN

## 2016-11-06 MED ORDER — LISINOPRIL 10 MG PO TABS
10.0000 mg | ORAL_TABLET | Freq: Every day | ORAL | Status: DC
  Administered 2016-11-07 – 2016-11-08 (×2): 10 mg via ORAL
  Filled 2016-11-06 (×2): qty 1

## 2016-11-06 MED ORDER — LORAZEPAM 1 MG PO TABS: 1.0000 mg | ORAL_TABLET | Freq: Three times a day (TID) | ORAL | Status: DC | PRN

## 2016-11-06 MED ORDER — LORAZEPAM 1 MG PO TABS: 0.0000 mg | ORAL_TABLET | Freq: Two times a day (BID) | ORAL | Status: DC

## 2016-11-06 MED ORDER — ONDANSETRON HCL 4 MG PO TABS: 4.0000 mg | ORAL_TABLET | Freq: Three times a day (TID) | ORAL | Status: DC | PRN

## 2016-11-06 MED ORDER — QUETIAPINE FUMARATE 100 MG PO TABS
100.0000 mg | ORAL_TABLET | Freq: Every day | ORAL | Status: DC
  Administered 2016-11-06 – 2016-11-07 (×2): 100 mg via ORAL
  Filled 2016-11-06 (×2): qty 4
  Filled 2016-11-06 (×2): qty 1

## 2016-11-06 MED ORDER — IBUPROFEN 400 MG PO TABS: 600.0000 mg | ORAL_TABLET | Freq: Three times a day (TID) | ORAL | Status: DC | PRN

## 2016-11-06 MED ORDER — DIVALPROEX SODIUM ER 500 MG PO TB24
500.0000 mg | ORAL_TABLET | Freq: Every day | ORAL | Status: DC
  Administered 2016-11-06 – 2016-11-07 (×2): 500 mg via ORAL
  Filled 2016-11-06 (×2): qty 1

## 2016-11-06 MED ORDER — ZOLPIDEM TARTRATE 5 MG PO TABS: 5.0000 mg | ORAL_TABLET | Freq: Every evening | ORAL | Status: DC | PRN

## 2016-11-06 MED ORDER — ALUM & MAG HYDROXIDE-SIMETH 200-200-20 MG/5ML PO SUSP: 30.0000 mL | ORAL | Status: DC | PRN

## 2016-11-06 NOTE — ED Notes (Signed)
Pt. Given a Happy Meal  

## 2016-11-06 NOTE — ED Notes (Signed)
Patient ambulated to the room indecently no acute distress noted
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
## 2016-11-06 NOTE — ED Provider Notes (Signed)
Patient is resting comfortably. Cooperative. Stating he's thinking of harming himself or someone else. He is in agreement to staying in the hospital for psychiatric evaluation. Results for orders placed or performed during the hospital encounter of 11/06/16  Comprehensive metabolic panel  Result Value Ref Range   Sodium 140 135 - 145 mmol/L   Potassium 3.7 3.5 - 5.1 mmol/L   Chloride 104 101 - 111 mmol/L   CO2 26 22 - 32 mmol/L   Glucose, Bld 113 (H) 65 - 99 mg/dL   BUN 9 6 - 20 mg/dL   Creatinine, Ser 0.981.48 (H) 0.61 - 1.24 mg/dL   Calcium 9.1 8.9 - 11.910.3 mg/dL   Total Protein 7.3 6.5 - 8.1 g/dL   Albumin 4.0 3.5 - 5.0 g/dL   AST 33 15 - 41 U/L   ALT 31 17 - 63 U/L   Alkaline Phosphatase 68 38 - 126 U/L   Total Bilirubin 0.7 0.3 - 1.2 mg/dL   GFR calc non Af Amer 57 (L) >60 mL/min   GFR calc Af Amer >60 >60 mL/min   Anion gap 10 5 - 15  Ethanol  Result Value Ref Range   Alcohol, Ethyl (B) 182 (H) <5 mg/dL  Salicylate level  Result Value Ref Range   Salicylate Lvl <7.0 2.8 - 30.0 mg/dL  Acetaminophen level  Result Value Ref Range   Acetaminophen (Tylenol), Serum <10 (L) 10 - 30 ug/mL  cbc  Result Value Ref Range   WBC 7.0 4.0 - 10.5 K/uL   RBC 4.89 4.22 - 5.81 MIL/uL   Hemoglobin 14.8 13.0 - 17.0 g/dL   HCT 14.744.5 82.939.0 - 56.252.0 %   MCV 91.0 78.0 - 100.0 fL   MCH 30.3 26.0 - 34.0 pg   MCHC 33.3 30.0 - 36.0 g/dL   RDW 13.013.4 86.511.5 - 78.415.5 %   Platelets 324 150 - 400 K/uL  Rapid urine drug screen (hospital performed)  Result Value Ref Range   Opiates NONE DETECTED NONE DETECTED   Cocaine POSITIVE (A) NONE DETECTED   Benzodiazepines NONE DETECTED NONE DETECTED   Amphetamines POSITIVE (A) NONE DETECTED   Tetrahydrocannabinol POSITIVE (A) NONE DETECTED   Barbiturates NONE DETECTED NONE DETECTED   No results found.  Patient is medically cleared for psychiatric hospitalization   Doug SouSam Adajah Cocking, MD 11/06/16 2337

## 2016-11-06 NOTE — ED Notes (Signed)
HS snack given.
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
## 2016-11-06 NOTE — ED Notes (Addendum)
Pt ambulatory to Pod F w/Sitter. Pt's belongings x 2 labeled belongings bags placed at nurses' desk for inventory. Pt wearing burgundy scrubs. Per report, pt ate dinner. Pt signed Medical Clearance Pt policy form and verbalized understanding - copy given to pt.
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
## 2016-11-06 NOTE — ED Notes (Signed)
Urine specimen cup given - pt aware of need for specimen.

## 2016-11-07 NOTE — ED Notes (Signed)
Patient ambulated independently to restroom without difficulty.
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
## 2016-11-07 NOTE — ED Notes (Signed)
Breakfast order placed ?

## 2016-11-07 NOTE — ED Notes (Signed)
Re-TTS being performed.  
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
## 2016-11-07 NOTE — ED Notes (Signed)
Woke up and ate lunch then returned to sleeping.

## 2016-11-07 NOTE — Progress Notes (Signed)
Patient has been referred to inpatient treatment programs at the following facilities: [facility] - per Joice, accepting referrals today.  [facility] - per [place], fax it.  [facility] - per Evelin.  Patient has adult medicaid and can't be referred to [facility], [facility], and [facility].   At capacity: [facility], [facility], [facility], [facility], [facility], [facility], [address] [facility], [facility].  CSW in disposition will continue to follow up and seek placement for patient.  [place] Abtsatia Mckale Haffey, LCSWA Disposition staff 11/07/2016 12:03 PM

## 2016-11-07 NOTE — ED Notes (Signed)
Patient eating dinner.
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
## 2016-11-08 DIAGNOSIS — F1290 Cannabis use, unspecified, uncomplicated: Secondary | ICD-10-CM | POA: Diagnosis not present

## 2016-11-08 DIAGNOSIS — F1490 Cocaine use, unspecified, uncomplicated: Secondary | ICD-10-CM

## 2016-11-08 DIAGNOSIS — F1920 Other psychoactive substance dependence, uncomplicated: Secondary | ICD-10-CM

## 2016-11-08 DIAGNOSIS — Z79899 Other long term (current) drug therapy: Secondary | ICD-10-CM | POA: Diagnosis not present

## 2016-11-08 DIAGNOSIS — Z79891 Long term (current) use of opiate analgesic: Secondary | ICD-10-CM | POA: Diagnosis not present

## 2016-11-08 DIAGNOSIS — F17210 Nicotine dependence, cigarettes, uncomplicated: Secondary | ICD-10-CM | POA: Diagnosis not present

## 2016-11-08 NOTE — ED Notes (Signed)
Lunch tray ordered 

## 2016-11-08 NOTE — ED Notes (Signed)
Snack provided

## 2016-11-08 NOTE — ED Notes (Signed)
Pt noted asleep 

## 2016-11-08 NOTE — ED Notes (Signed)
TTS completed. 

## 2016-11-08 NOTE — Discharge Instructions (Signed)
We saw you in the ER for your mental health concerns and had our behavioral health team evaluate you. °The team feels comfortable sending you home, please follow the recommendations given to you by them and the follow-up and medications they have prescribed to you. °Please refrain from substance abuse. °Return to the ER if your symptoms worsen. ° ° ° °Substance Abuse Treatment Programs ° °Intensive Outpatient Programs °[facility]     °[address]      °[place], [place]                   °[phone]      ° °[facility] °[address] °[place], [place] °[phone] ° °[facility]     °(Inpatient and outpatient)     °[address]           °[phone]   ° °[facility] °[phone] (Suboxone and Methadone) ° °[address]      °[place], [place] [address]      °[phone]      ° °[address] °[place], [place] °852-3033 ° °[facility] (Outpatient/Inpatient, [facility])    °(insurance only) [phone]      °       °[facility]Groups & Residential) °[place], [place] °[phone] ° °   °[facility]     °[address]     °[place], [place]      °[phone]      ° °[facility] (for caregivers and family) °[address] °[place], [place] °[phone] ° ° ° ° ° °Residential Treatment Programs °[facility]      °[address], [place], [place] [address]  °([phone]      ° °[facility] °[address] [place], [place] [address] °[phone] ° °[facility]        °[phone]      ° °[facility] °1-800-659-3381 ° °[facility]             °[address]                                         °[place], [place]                                                °877-615-2722 or [phone]                              ° °[facility] [place] °[address] °[place], [address] °[phone] ° °[facility]    °[address]      °[place], [place]     °[phone]      ° °[facility] °[address] °, [place] °[phone] ° °[facility]   °[address]     °[place], [place] [address]     °[phone]      °Admissions: 8am-3pm M-F ° °[facility] °[address] °[place], [place] °336-227-7417 ° °[facility]: Residential Program (90 Days)   °[place],       °336-725-8389 or [phone]    ° °  [facility]: [facility] °[place], [place] °(Walk in Hours over the weekend or by referral) ° °[facility] °[address], [place], [place] [address] °([phone]) [phone] ° °Crisis Mobile: [facility]:  1-877-626-1772 (for crisis response 24 hours a day) °[facility] Hotline:      1-800-256-2452 °Outpatient Psychiatry and Counseling ° °[facility]: Mobile Crisis Management 24 hours:  1-877-626-1772 ° °[facility] sliding scale fee and walk in schedule: M-F 8am-12pm/1pm-3pm °[address]  °[place], [place] [address] °[phone] ° °[facility] °[address] °[place], [place] [address] °[phone] ° °[facility] ([facility])- new patient walk-in appointments available Monday - Friday 8am -3pm.          °[address] °[place], [place] [address] °336-676-6840 or crisis line- 336-676-6905 ° °[facility]/ Intensive Outpatient Therapy Program °[address] °[place], [place] [address] °336-832-9804 ° °[facility]                  °Crisis Services      °[phone]      °[address]     °[place], [place] [address]                ° °[facility]   °[facility] °[phone] °[address] °[place], [place] [address] ° ° °[facility]          °[address],  °[place], [place] [address]       °([phone] ° °[facility] °[address] °[place], [place] [address] °336-292-1510 ° °[facility]    °[address]    °[place], [place] [address]     °[phone]      ° °Parish McKinney, MD     °[address]     °[place] [address]     °336-282-1251     °  °[facility] °[address] °[place] [address] ° °[facility]     °[address]     °[place], [place]      °336-542-2076      ° °[facility] °Shamsher Ahluwalia, MD °[address] °[place], [place] [address] °[phone] ° °[facility]     °[address]     °[place], [place] [address]     °336-274-1237      ° °[facility] °[address] °[place], [place] [address] °[phone] °Resources for Temporary Residential Assistance/Crisis Centers ° °DAY CENTERS °[facility] °M-F 8am-3pm   °[address] [place], [place] [address]   336-332-0824 °Services include: laundry, barbering, support groups, case management, phone  & computer access, showers, AA/NA mtgs, mental health/substance abuse nurse, job skills class, disability information, VA assistance, spiritual classes, etc.  ° °HOMELESS SHELTERS ° °[facility]     °[facility]   °[address], [place]     °[phone]       °       °[facility] (women and children)       °[address] °[place], [place] [address] °336-275-0820 °[email or web address] for application and process °Application Required ° °[facility]   °[address]    °[place] [address]     °[phone]       °             °  [place], [place] [address] [phone](TMAUQJFH application appt.) Application Required  [facility] (women only)    [address]     [place], [place] [address]     [phone]      Intake starts 6pm daily Need valid ID, SSC, & Police  report [employer] [address] [place], [place] [phone] Application Required  [employer] (men only)     [place]      [place], [place]       [facility] (Pregnant women only) [address] [place], [place]  [facility]      [facility] [address]i [address].      [place], [place] [address]     [phone]             [facility] [address] [place], [place] 90 day commitment/SA/Application process  [facility](men only)     [address]     [place], [place]       Check-in at [facility] [address] [place], [place] [address] [phone] Men/Women/Women and Children must be there by 7 pm  [place], [place]

## 2016-11-08 NOTE — ED Notes (Signed)
Patient given belongings and signed for paperwork. Patient changed into clothes and escorted out by staff.

## 2016-11-08 NOTE — ED Provider Notes (Signed)
Spoke with Fredricka Bonineonnor and Vernona RiegerLaura - both of whom are with psych team. Patient has been staffed by Dr. Jama Flavorsobos and he is deemed safe for discharge with outpatient resources.    Derwood KaplanAnkit Deone Leifheit, MD 11/08/16 1428
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
## 2016-11-08 NOTE — ED Notes (Signed)
Patient on the phone. SW with patient helping him to put a hold on his credit/debit cards.

## 2017-03-10 ENCOUNTER — Encounter (HOSPITAL_BASED_OUTPATIENT_CLINIC_OR_DEPARTMENT_OTHER): Payer: Self-pay | Admitting: *Deleted

## 2017-03-10 ENCOUNTER — Emergency Department (HOSPITAL_BASED_OUTPATIENT_CLINIC_OR_DEPARTMENT_OTHER)
Admission: EM | Admit: 2017-03-10 | Discharge: 2017-03-10 | Disposition: A | Discharge status: Home / Self Care | Payer: Medicaid Other | Attending: Emergency Medicine | Admitting: Emergency Medicine

## 2017-03-10 DIAGNOSIS — F17210 Nicotine dependence, cigarettes, uncomplicated: Secondary | ICD-10-CM | POA: Insufficient documentation

## 2017-03-10 DIAGNOSIS — R6883 Chills (without fever): Secondary | ICD-10-CM | POA: Diagnosis not present

## 2017-03-10 DIAGNOSIS — I10 Essential (primary) hypertension: Secondary | ICD-10-CM | POA: Diagnosis not present

## 2017-03-10 DIAGNOSIS — Z79899 Other long term (current) drug therapy: Secondary | ICD-10-CM | POA: Insufficient documentation

## 2017-03-10 DIAGNOSIS — K0889 Other specified disorders of teeth and supporting structures: Secondary | ICD-10-CM | POA: Diagnosis not present

## 2017-03-10 MED ORDER — MAGIC MOUTHWASH: 5.0000 mL | Freq: Three times a day (TID) | ORAL | 0 refills | Status: DC | PRN

## 2017-03-10 MED ORDER — AMOXICILLIN-POT CLAVULANATE 875-125 MG PO TABS: 1.0000 | ORAL_TABLET | Freq: Two times a day (BID) | ORAL | 0 refills | Status: AC

## 2017-03-10 MED ORDER — IBUPROFEN 800 MG PO TABS
800.0000 mg | ORAL_TABLET | Freq: Once | ORAL | Status: AC
  Administered 2017-03-10: 800 mg via ORAL
  Filled 2017-03-10: qty 1

## 2017-03-10 MED ORDER — IBUPROFEN 800 MG PO TABS: 800.0000 mg | ORAL_TABLET | Freq: Three times a day (TID) | ORAL | 0 refills | Status: DC

## 2017-03-10 NOTE — ED Provider Notes (Signed)
[facility] Provider Note   CSN: [id number] Arrival date & time: 03/10/17  1106     History   Chief Complaint Chief Complaint  Patient presents with  . Dental Pain    HPI Eric Livingston is a 41 y.o. male presenting with sudden onset of constant throbbing right upper molar pain after broken tooth and filling falling off 2 days ago.  Endorses chills yesterday, no recorded fevers, nausea, vomiting, difficulty swallowing, facial swelling or other symptoms.  HPI  Past Medical History:  Diagnosis Date  . Alcohol abuse   . Depression   . Hyperlipidemia   . Hypertension     Patient Active Problem List   Diagnosis Date Noted  . Essential hypertension, benign 03/25/2013  . Polysubstance dependence (H[facility]) 03/24/2013  . Alcohol dependence syndrome ([facility]) 03/24/2013  . Substance induced mood disorder ([facility]) 03/24/2013  . Suicidal thoughts 03/24/2013    Past Surgical History:  Procedure Laterality Date  . HERNIA REPAIR         Home Medications    Prior to Admission medications   Medication Sig Start Date End Date Taking? Authorizing Provider  albuterol (PROVENTIL HFA;VENTOLIN HFA) 108 (90 Base) MCG/ACT inhaler Inhale 2 puffs into the lungs every 6 (six) hours as needed for wheezing or shortness of breath.    [provider]  amoxicillin-clavulanate (AUGMENTIN) 875-125 MG tablet Take 1 tablet by mouth 2 (two) times daily. 03/10/17 03/17/17  Mathews RobinsonsMitchell, Deshonda Cryderman B, PA-C  dexamethasone (DECADRON) 1 MG tablet Take 1 tablet (1 mg total) by mouth 2 (two) times daily with a meal. Patient not taking: Reported on 11/07/2016 04/08/15   Renne CriglerGeiple, Joshua, PA-C  divalproex (DEPAKOTE ER) 500 MG 24 hr tablet Take 1 tablet (500 mg total) by mouth at bedtime. For mood stabilization Patient not taking: Reported on 11/07/2016 04/02/13   Armandina StammerNwoko, Agnes I, NP  hydrochlorothiazide (HYDRODIURIL) 25 MG tablet Take 25 mg by mouth daily.    [provider]  HYDROcodone-acetaminophen  (NORCO) 5-325 MG per tablet Take 1-2 tablets by mouth every 6 (six) hours as needed for severe pain. Patient not taking: Reported on 11/06/2016 03/31/15   Doug SouJacubowitz, Sam, MD  hydrOXYzine (ATARAX/VISTARIL) 50 MG tablet Take 1 tablet (50 mg total) by mouth at bedtime as needed (sleep). For sleep Patient not taking: Reported on 11/07/2016 04/02/13   Armandina StammerNwoko, Agnes I, NP  ibuprofen (ADVIL,MOTRIN) 800 MG tablet Take 1 tablet (800 mg total) by mouth 3 (three) times daily. 03/10/17   Mathews RobinsonsMitchell, Lavone Barrientes B, PA-C  lisinopril (PRINIVIL,ZESTRIL) 10 MG tablet Take 1 tablet (10 mg total) by mouth daily. For high blood pressure control Patient not taking: Reported on 11/07/2016 04/02/13   Armandina StammerNwoko, Agnes I, NP  magic mouthwash SOLN Take 5 mLs by mouth 3 (three) times daily as needed for mouth pain. 03/10/17   Georgiana ShoreMitchell, Dorraine Ellender B, PA-C  Multiple Vitamin (MULTIVITAMIN WITH MINERALS) TABS tablet Take 1 tablet by mouth daily. For vitamin supplement Patient not taking: Reported on 11/06/2016 04/03/13   Armandina StammerNwoko, Agnes I, NP  oxyCODONE-acetaminophen (PERCOCET/ROXICET) 5-325 MG per tablet Take 1-2 tablets by mouth every 6 (six) hours as needed for severe pain. Patient not taking: Reported on 11/06/2016 04/08/15   Renne CriglerGeiple, Joshua, PA-C  promethazine (PHENERGAN) 25 MG tablet Take 1 tablet (25 mg total) by mouth every 6 (six) hours as needed for nausea or vomiting. Patient not taking: Reported on 11/07/2016 03/23/15   Pisciotta, Joni ReiningNicole, PA-C  QUEtiapine (SEROQUEL) 100 MG tablet Take 1 tablet (100 mg total) by  mouth at bedtime. For mood control 04/02/13   Armandina Stammer I, NP  verapamil (CALAN) 80 MG tablet Take 1 tablet (80 mg total) by mouth 3 (three) times daily. Patient not taking: Reported on 11/07/2016 03/23/15   Pisciotta, Mardella Layman    Family History No family history on file.  Social History Social History  Substance Use Topics  . Smoking status: Current Every Day Smoker    Packs/day: 1.00    Years: 15.00    Types: Cigarettes  .  Smokeless tobacco: Current User  . Alcohol use Yes     Allergies   Patient has no known allergies.   Review of Systems Review of Systems  Constitutional: Positive for chills. Negative for fever.  HENT: Positive for dental problem. Negative for congestion, drooling, ear pain, facial swelling, mouth sores, sinus pain, sinus pressure, sore throat, trouble swallowing and voice change.   Eyes: Negative for pain and visual disturbance.  Respiratory: Negative for cough, shortness of breath, wheezing and stridor.   Cardiovascular: Negative for chest pain and palpitations.  Gastrointestinal: Negative for abdominal pain, nausea and vomiting.  Musculoskeletal: Negative for myalgias, neck pain and neck stiffness.  Skin: Negative for color change, pallor, rash and wound.  Neurological: Negative for dizziness, seizures, syncope, facial asymmetry, speech difficulty, light-headedness and headaches.     Physical Exam Updated Vital Signs BP (!) 145/107   Pulse 67   Temp 98.3 F (36.8 C) (Oral)   Resp 18   Ht 6\' 3"  (1.905 m)   Wt 108.9 kg (240 lb)   SpO2 100%   BMI 30.00 kg/m   Physical Exam  Constitutional: He appears well-developed and well-nourished. No distress.  Afebrile, nontoxic-appearing, sitting up in chair in no acute distress  HENT:  Head: Normocephalic and atraumatic.  Mouth/Throat: Oropharynx is clear and moist and mucous membranes are normal. No trismus in the jaw. Abnormal dentition. Dental caries present. No uvula swelling. No oropharyngeal exudate, posterior oropharyngeal edema, posterior oropharyngeal erythema or tonsillar abscesses.    No gross oral abscess. Uvula is midline, arches are simmetrical and intact. No peritonsilar swelling or exudate. No trismus. Sublingual mucosa is soft and non-tender. Tolerating oral secretions. No concern for ludwig's angina.  Eyes: Conjunctivae and EOM are normal.  Neck: Normal range of motion. Neck supple.  Cardiovascular: Normal rate,  regular rhythm and normal heart sounds.   No murmur heard. Pulmonary/Chest: Effort normal and breath sounds normal. No stridor. No respiratory distress. He has no wheezes. He has no rales.  Abdominal: He exhibits no distension.  Musculoskeletal: Normal range of motion. He exhibits no edema.  Lymphadenopathy:    He has no cervical adenopathy.  Neurological: He is alert.  Skin: Skin is warm and dry. No rash noted. He is not diaphoretic. No erythema. No pallor.  Psychiatric: He has a normal mood and affect.  Nursing note and vitals reviewed.    ED Treatments / Results  Labs (all labs ordered are listed, but only abnormal results are displayed) Labs Reviewed - No data to display  EKG  EKG Interpretation None       Radiology No results found.  Procedures Procedures (including critical care time)  Medications Ordered in ED Medications  ibuprofen (ADVIL,MOTRIN) tablet 800 mg (not administered)     Initial Impression / Assessment and Plan / ED Course  I have reviewed the triage vital signs and the nursing notes.  Pertinent labs & imaging results that were available during my care of the patient were reviewed  by me and considered in my medical decision making (see chart for details).     Patient with toothache.  No gross abscess.  Exam unconcerning for Ludwig's angina or spread of infection.  Will treat with penicillin and pain medicine.  Urged patient to follow-up with dentist and provided resources.  He is well-appearing, afebrile and nontoxic.  Discussed strict return precautions and advised to return to the emergency department if experiencing any new or worsening symptoms. Instructions were understood and patient agreed with discharge plan.  Final Clinical Impressions(s) / ED Diagnoses   Final diagnoses:  Pain, dental    New Prescriptions New Prescriptions   AMOXICILLIN-CLAVULANATE (AUGMENTIN) 875-125 MG TABLET    Take 1 tablet by mouth 2 (two) times daily.    IBUPROFEN (ADVIL,MOTRIN) 800 MG TABLET    Take 1 tablet (800 mg total) by mouth 3 (three) times daily.   MAGIC MOUTHWASH SOLN    Take 5 mLs by mouth 3 (three) times daily as needed for mouth pain.     Georgiana ShoreMitchell, Teila Skalsky B, PA-C 03/10/17 1239    Mesner, Barbara CowerJason, MD 03/10/17 [phone]

## 2017-03-10 NOTE — Discharge Instructions (Signed)
As discussed, follow up with a dentist for repair of chipped tooth. Ibuprofen for pain. Take your entire course of antibiotics even if you feel better. Stay well-hydrated and follow up with a primary care provider as needed. Return to the ER if facial swelling, redness, fever, nausea, vomiting, difficulty breathing, difficulty swallowing or other concerning symptoms in the meantime.

## 2017-03-10 NOTE — ED Triage Notes (Signed)
Dental pain. [facility] is an [facility].

## 2017-05-25 ENCOUNTER — Emergency Department (HOSPITAL_BASED_OUTPATIENT_CLINIC_OR_DEPARTMENT_OTHER)
Admission: EM | Admit: 2017-05-25 | Discharge: 2017-05-25 | Disposition: A | Discharge status: Home / Self Care | Payer: Medicaid Other | Attending: Emergency Medicine | Admitting: Emergency Medicine

## 2017-05-25 ENCOUNTER — Encounter (HOSPITAL_BASED_OUTPATIENT_CLINIC_OR_DEPARTMENT_OTHER): Payer: Self-pay | Admitting: *Deleted

## 2017-05-25 DIAGNOSIS — Z79899 Other long term (current) drug therapy: Secondary | ICD-10-CM | POA: Diagnosis not present

## 2017-05-25 DIAGNOSIS — F17210 Nicotine dependence, cigarettes, uncomplicated: Secondary | ICD-10-CM | POA: Insufficient documentation

## 2017-05-25 DIAGNOSIS — I10 Essential (primary) hypertension: Secondary | ICD-10-CM | POA: Diagnosis not present

## 2017-05-25 DIAGNOSIS — K047 Periapical abscess without sinus: Secondary | ICD-10-CM | POA: Insufficient documentation

## 2017-05-25 DIAGNOSIS — K0889 Other specified disorders of teeth and supporting structures: Secondary | ICD-10-CM | POA: Diagnosis present

## 2017-05-25 MED ORDER — BENZOCAINE 10 % MT GEL: 1.0000 "application " | OROMUCOSAL | 0 refills | Status: DC | PRN

## 2017-05-25 MED ORDER — IBUPROFEN 800 MG PO TABS: 800.0000 mg | ORAL_TABLET | Freq: Three times a day (TID) | ORAL | 0 refills | Status: DC

## 2017-05-25 MED ORDER — AMOXICILLIN 500 MG PO CAPS: 500.0000 mg | ORAL_CAPSULE | Freq: Three times a day (TID) | ORAL | 0 refills | Status: DC

## 2017-05-25 NOTE — ED Provider Notes (Signed)
[facility] Provider Note   CSN: [id number] Arrival date & time: 05/25/17  1900     History   Chief Complaint Chief Complaint  Patient presents with  . Dental Pain    HPI Eric Livingston is a 41 y.o. male.  Pt presents to the ED today with dental pain.  Pt has a hx of dental caries and has seen the dentist and [facility] for the same.  Pt has been on 2 rounds of abx.  Pt has a hx of polysubstance abuse and has been in [facility] for treatment of his abuse.  The pt has not been able to follow back with the dentist since he's been in [facility] and his pain is back.  The pain goes away with abx tx.  Pt denies any f/c.      Past Medical History:  Diagnosis Date  . Alcohol abuse   . Depression   . Hyperlipidemia   . Hypertension     Patient Active Problem List   Diagnosis Date Noted  . Essential hypertension, benign 03/25/2013  . Polysubstance dependence (HCC) 03/24/2013  . Alcohol dependence syndrome (HCC) 03/24/2013  . Substance induced mood disorder (HCC) 03/24/2013  . Suicidal thoughts 03/24/2013    Past Surgical History:  Procedure Laterality Date  . HERNIA REPAIR         Home Medications    Prior to Admission medications   Medication Sig Start Date End Date Taking? Authorizing Provider  albuterol (PROVENTIL HFA;VENTOLIN HFA) 108 (90 Base) MCG/ACT inhaler Inhale 2 puffs into the lungs every 6 (six) hours as needed for wheezing or shortness of breath.    [provider]  amoxicillin (AMOXIL) 500 MG capsule Take 1 capsule (500 mg total) by mouth 3 (three) times daily. 05/25/17   Jacalyn Lefevre, MD  benzocaine (ORAJEL) 10 % mucosal gel Use as directed 1 application in the mouth or throat as needed for mouth pain. 05/25/17   Jacalyn Lefevre, MD  dexamethasone (DECADRON) 1 MG tablet Take 1 tablet (1 mg total) by mouth 2 (two) times daily with a meal. Patient not taking: Reported on 11/07/2016 04/08/15   Renne Crigler, PA-C  divalproex  (DEPAKOTE ER) 500 MG 24 hr tablet Take 1 tablet (500 mg total) by mouth at bedtime. For mood stabilization Patient not taking: Reported on 11/07/2016 04/02/13   Armandina Stammer I, NP  hydrochlorothiazide (HYDRODIURIL) 25 MG tablet Take 25 mg by mouth daily.    [provider]  HYDROcodone-acetaminophen (NORCO) 5-325 MG per tablet Take 1-2 tablets by mouth every 6 (six) hours as needed for severe pain. Patient not taking: Reported on 11/06/2016 03/31/15   Doug Sou, MD  hydrOXYzine (ATARAX/VISTARIL) 50 MG tablet Take 1 tablet (50 mg total) by mouth at bedtime as needed (sleep). For sleep Patient not taking: Reported on 11/07/2016 04/02/13   Armandina Stammer I, NP  ibuprofen (ADVIL,MOTRIN) 800 MG tablet Take 1 tablet (800 mg total) by mouth 3 (three) times daily. 05/25/17   Jacalyn Lefevre, MD  lisinopril (PRINIVIL,ZESTRIL) 10 MG tablet Take 1 tablet (10 mg total) by mouth daily. For high blood pressure control Patient not taking: Reported on 11/07/2016 04/02/13   Armandina Stammer I, NP  magic mouthwash SOLN Take 5 mLs by mouth 3 (three) times daily as needed for mouth pain. 03/10/17   Georgiana Shore, PA-C  Multiple Vitamin (MULTIVITAMIN WITH MINERALS) TABS tablet Take 1 tablet by mouth daily. For vitamin supplement Patient not taking: Reported on 11/06/2016 04/03/13  Armandina Stammer I, NP  oxyCODONE-acetaminophen (PERCOCET/ROXICET) 5-325 MG per tablet Take 1-2 tablets by mouth every 6 (six) hours as needed for severe pain. Patient not taking: Reported on 11/06/2016 04/08/15   Renne Crigler, PA-C  promethazine (PHENERGAN) 25 MG tablet Take 1 tablet (25 mg total) by mouth every 6 (six) hours as needed for nausea or vomiting. Patient not taking: Reported on 11/07/2016 03/23/15   Pisciotta, Joni Reining, PA-C  QUEtiapine (SEROQUEL) 100 MG tablet Take 1 tablet (100 mg total) by mouth at bedtime. For mood control 04/02/13   Armandina Stammer I, NP  verapamil (CALAN) 80 MG tablet Take 1 tablet (80 mg total) by mouth 3 (three)  times daily. Patient not taking: Reported on 11/07/2016 03/23/15   Pisciotta, Mardella Layman    Family History History reviewed. No pertinent family history.  Social History Social History  Substance Use Topics  . Smoking status: Current Every Day Smoker    Packs/day: 1.00    Years: 15.00    Types: Cigarettes  . Smokeless tobacco: Current User  . Alcohol use Yes     Allergies   Patient has no known allergies.   Review of Systems Review of Systems  HENT: Positive for dental problem.   All other systems reviewed and are negative.    Physical Exam Updated Vital Signs BP (!) 134/93   Pulse 65   Temp 98.9 F (37.2 C)   Resp 16   Ht  (1.905 m)   Wt 110.7 kg (244 lb)   SpO2 100%   BMI 30.50 kg/m   Physical Exam  Constitutional: He appears well-developed and well-nourished.  HENT:  Head: Normocephalic and atraumatic.  Right Ear: External ear normal.  Left Ear: External ear normal.  Nose: Nose normal.  Mouth/Throat: Dental abscesses present.  Eyes: Pupils are equal, round, and reactive to light. Conjunctivae and EOM are normal.  Neck: Normal range of motion. Neck supple.  Cardiovascular: Normal rate, regular rhythm, normal heart sounds and intact distal pulses.   Pulmonary/Chest: Effort normal and breath sounds normal.  Abdominal: Soft. Bowel sounds are normal.  Neurological: He is alert.  Nursing note and vitals reviewed.    ED Treatments / Results  Labs (all labs ordered are listed, but only abnormal results are displayed) Labs Reviewed - No data to display  EKG  EKG Interpretation None       Radiology No results found.  Procedures Procedures (including critical care time)  Medications Ordered in ED Medications - No data to display   Initial Impression / Assessment and Plan / ED Course  I have reviewed the triage vital signs and the nursing notes.  Pertinent labs & imaging results that were available during my care of the patient were  reviewed by me and considered in my medical decision making (see chart for details).    Pt knows he needs to f/u with dentist.  He is started on amox here and is give rx for amox.  Return if worse.  Final Clinical Impressions(s) / ED Diagnoses   Final diagnoses:  Dental abscess    New Prescriptions New Prescriptions   AMOXICILLIN (AMOXIL) 500 MG CAPSULE    Take 1 capsule (500 mg total) by mouth 3 (three) times daily.   BENZOCAINE (ORAJEL) 10 % MUCOSAL GEL    Use as directed 1 application in the mouth or throat as needed for mouth pain.   IBUPROFEN (ADVIL,MOTRIN) 800 MG TABLET    Take 1 tablet (800 mg total) by mouth  3 (three) times daily.     Jacalyn Lefevre, MD 05/25/17 [phone]

## 2017-05-25 NOTE — ED Triage Notes (Addendum)
[facility] is from [facility]  c/o dental pain x 2 months

## 2017-05-25 NOTE — ED Notes (Signed)
ED Provider at bedside. 

## 2017-06-07 ENCOUNTER — Emergency Department (HOSPITAL_BASED_OUTPATIENT_CLINIC_OR_DEPARTMENT_OTHER)
Admission: EM | Admit: 2017-06-07 | Discharge: 2017-06-07 | Disposition: A | Discharge status: Home / Self Care | Payer: Medicaid Other | Attending: Emergency Medicine | Admitting: Emergency Medicine

## 2017-06-07 ENCOUNTER — Encounter (HOSPITAL_BASED_OUTPATIENT_CLINIC_OR_DEPARTMENT_OTHER): Payer: Self-pay | Admitting: Emergency Medicine

## 2017-06-07 DIAGNOSIS — K047 Periapical abscess without sinus: Secondary | ICD-10-CM | POA: Diagnosis not present

## 2017-06-07 DIAGNOSIS — Z79899 Other long term (current) drug therapy: Secondary | ICD-10-CM | POA: Diagnosis not present

## 2017-06-07 DIAGNOSIS — K0889 Other specified disorders of teeth and supporting structures: Secondary | ICD-10-CM | POA: Diagnosis present

## 2017-06-07 DIAGNOSIS — F17210 Nicotine dependence, cigarettes, uncomplicated: Secondary | ICD-10-CM | POA: Insufficient documentation

## 2017-06-07 DIAGNOSIS — I10 Essential (primary) hypertension: Secondary | ICD-10-CM | POA: Diagnosis not present

## 2017-06-07 DIAGNOSIS — R21 Rash and other nonspecific skin eruption: Secondary | ICD-10-CM | POA: Diagnosis not present

## 2017-06-07 HISTORY — DX: Other psychoactive substance abuse, uncomplicated: F19.10

## 2017-06-07 MED ORDER — CLINDAMYCIN HCL 150 MG PO CAPS
300.0000 mg | ORAL_CAPSULE | Freq: Once | ORAL | Status: AC
  Administered 2017-06-07: 300 mg via ORAL
  Filled 2017-06-07: qty 2

## 2017-06-07 MED ORDER — NAPROXEN 250 MG PO TABS
500.0000 mg | ORAL_TABLET | Freq: Two times a day (BID) | ORAL | Status: DC
Start: 2017-06-08 — End: 2017-06-07
  Filled 2017-06-07: qty 2

## 2017-06-07 MED ORDER — NAPROXEN 250 MG PO TABS
500.0000 mg | ORAL_TABLET | Freq: Two times a day (BID) | ORAL | Status: DC
  Administered 2017-06-07: 500 mg via ORAL

## 2017-06-07 MED ORDER — NAPROXEN 500 MG PO TABS: 500.0000 mg | ORAL_TABLET | Freq: Two times a day (BID) | ORAL | 0 refills | Status: DC

## 2017-06-07 MED ORDER — CLINDAMYCIN HCL 300 MG PO CAPS: 300.0000 mg | ORAL_CAPSULE | Freq: Three times a day (TID) | ORAL | 0 refills | Status: DC

## 2017-06-07 MED ORDER — TRIAMCINOLONE ACETONIDE 0.1 % EX CREA: 1.0000 "application " | TOPICAL_CREAM | Freq: Two times a day (BID) | CUTANEOUS | 0 refills | Status: DC

## 2017-06-07 NOTE — ED Triage Notes (Signed)
Patient is from [facility], reports that he has a rash to the back of his neck and also continued pain to his right mouth

## 2017-06-07 NOTE — Discharge Instructions (Signed)
Take Clindamycin three times daily. Take with food Take Naproxen twice daily with food for pain/inflammation Apply Triamcinolone (steroid cream) to rash twice daily Please follow up with dentist

## 2017-06-07 NOTE — ED Provider Notes (Signed)
[facility] EMERGENCY DEPARTMENT Provider Note   CSN: [id number] Arrival date & time: 06/07/17  1909     History   Chief Complaint Chief Complaint  Patient presents with  . Dental Pain    HPI Eric Livingston is a 41 y.o. male who presents with right upper dental pain and a rash.  Past medical history significant for polysubstance abuse currently undergoing treatment as an inpatient [facility].  He states that he has had intermittent pain to his right upper molar for several months.  He has been seen for this before and has been on several rounds of antibiotics with no relief.  He reports swelling to the upper gumline and the pain is worsened over the past couple days.  He was last treated with antibiotics couple weeks ago which improved his symptoms temporarily.  He has not been able to see a dentist because he has been at [facility].  Treatment has been going well he will be discharged within the next several days.  He also reports a rash that appeared on the back of his neck since yesterday.  He is unsure if it is caused by any of the detergents or from something at [facility].  He is never had this before but is very itchy.  Denies any fevers or difficulty swallowing.  HPI  Past Medical History:  Diagnosis Date  . Alcohol abuse   . Depression   . Hyperlipidemia   . Hypertension   . Polysubstance abuse [facility])     Patient Active Problem List   Diagnosis Date Noted  . Essential hypertension, benign 03/25/2013  . Polysubstance dependence (HCC) 03/24/2013  . Alcohol dependence syndrome (HCC) 03/24/2013  . Substance induced mood disorder (HCC) 03/24/2013  . Suicidal thoughts 03/24/2013    Past Surgical History:  Procedure Laterality Date  . HERNIA REPAIR         Home Medications    Prior to Admission medications   Medication Sig Start Date End Date Taking? Authorizing Provider  albuterol (PROVENTIL HFA;VENTOLIN HFA) 108 (90 Base) MCG/ACT inhaler Inhale 2 puffs into  the lungs every 6 (six) hours as needed for wheezing or shortness of breath.    [provider]  amoxicillin (AMOXIL) 500 MG capsule Take 1 capsule (500 mg total) by mouth 3 (three) times daily. 05/25/17   Jacalyn Lefevre, MD  benzocaine (ORAJEL) 10 % mucosal gel Use as directed 1 application in the mouth or throat as needed for mouth pain. 05/25/17   Jacalyn Lefevre, MD  dexamethasone (DECADRON) 1 MG tablet Take 1 tablet (1 mg total) by mouth 2 (two) times daily with a meal. Patient not taking: Reported on 11/07/2016 04/08/15   Renne Crigler, PA-C  divalproex (DEPAKOTE ER) 500 MG 24 hr tablet Take 1 tablet (500 mg total) by mouth at bedtime. For mood stabilization Patient not taking: Reported on 11/07/2016 04/02/13   Armandina Stammer I, NP  hydrochlorothiazide (HYDRODIURIL) 25 MG tablet Take 25 mg by mouth daily.    [provider]  HYDROcodone-acetaminophen (NORCO) 5-325 MG per tablet Take 1-2 tablets by mouth every 6 (six) hours as needed for severe pain. Patient not taking: Reported on 11/06/2016 03/31/15   Doug Sou, MD  hydrOXYzine (ATARAX/VISTARIL) 50 MG tablet Take 1 tablet (50 mg total) by mouth at bedtime as needed (sleep). For sleep Patient not taking: Reported on 11/07/2016 04/02/13   Armandina Stammer I, NP  ibuprofen (ADVIL,MOTRIN) 800 MG tablet Take 1 tablet (800 mg total) by mouth 3 (  three) times daily. 05/25/17   Jacalyn Lefevre, MD  lisinopril (PRINIVIL,ZESTRIL) 10 MG tablet Take 1 tablet (10 mg total) by mouth daily. For high blood pressure control Patient not taking: Reported on 11/07/2016 04/02/13   Armandina Stammer I, NP  magic mouthwash SOLN Take 5 mLs by mouth 3 (three) times daily as needed for mouth pain. 03/10/17   Georgiana Shore, PA-C  Multiple Vitamin (MULTIVITAMIN WITH MINERALS) TABS tablet Take 1 tablet by mouth daily. For vitamin supplement Patient not taking: Reported on 11/06/2016 04/03/13   Armandina Stammer I, NP  oxyCODONE-acetaminophen (PERCOCET/ROXICET) 5-325  MG per tablet Take 1-2 tablets by mouth every 6 (six) hours as needed for severe pain. Patient not taking: Reported on 11/06/2016 04/08/15   Renne Crigler, PA-C  promethazine (PHENERGAN) 25 MG tablet Take 1 tablet (25 mg total) by mouth every 6 (six) hours as needed for nausea or vomiting. Patient not taking: Reported on 11/07/2016 03/23/15   Pisciotta, Joni Reining, PA-C  QUEtiapine (SEROQUEL) 100 MG tablet Take 1 tablet (100 mg total) by mouth at bedtime. For mood control 04/02/13   Armandina Stammer I, NP  verapamil (CALAN) 80 MG tablet Take 1 tablet (80 mg total) by mouth 3 (three) times daily. Patient not taking: Reported on 11/07/2016 03/23/15   Pisciotta, Mardella Layman    Family History History reviewed. No pertinent family history.  Social History Social History  Substance Use Topics  . Smoking status: Current Every Day Smoker    Packs/day: 1.00    Years: 15.00    Types: Cigarettes  . Smokeless tobacco: Current User  . Alcohol use Yes     Allergies   Patient has no known allergies.   Review of Systems Review of Systems  Constitutional: Negative for fever.  HENT: Positive for dental problem. Negative for facial swelling and trouble swallowing.   Skin: Positive for rash.     Physical Exam Updated Vital Signs BP (!) 142/100 (BP Location: Right Arm)   Pulse 66   Temp 98.7 F (37.1 C) (Oral)   Resp 18   Ht 6\' 3"  (1.905 m)   Wt 108.9 kg (240 lb)   SpO2 100%   BMI 30.00 kg/m   Physical Exam  Constitutional: He is oriented to person, place, and time. He appears well-developed and well-nourished. No distress.  HENT:  Head: Normocephalic and atraumatic.  Right upper dental abscess  Eyes: Pupils are equal, round, and reactive to light. Conjunctivae are normal. Right eye exhibits no discharge. Left eye exhibits no discharge. No scleral icterus.  Neck: Normal range of motion.  Cardiovascular: Normal rate.   Pulmonary/Chest: Effort normal. No respiratory distress.  Abdominal: He  exhibits no distension.  Neurological: He is alert and oriented to person, place, and time.  Skin: Skin is warm and dry. Rash (papular rash over the back of neck) noted.  Psychiatric: He has a normal mood and affect. His behavior is normal.  Nursing note and vitals reviewed.    ED Treatments / Results  Labs (all labs ordered are listed, but only abnormal results are displayed) Labs Reviewed - No data to display  EKG  EKG Interpretation None       Radiology No results found.  Procedures Procedures (including critical care time)  Medications Ordered in ED Medications  naproxen (NAPROSYN) tablet 500 mg (500 mg Oral Given 06/07/17 2015)  clindamycin (CLEOCIN) capsule 300 mg (300 mg Oral Given 06/07/17 2015)     Initial Impression / Assessment and Plan / ED Course  I have reviewed the triage vital signs and the nursing notes.  Pertinent labs & imaging results that were available during my care of the patient were reviewed by me and considered in my medical decision making (see chart for details).  41 year old male presents with dental abscess and non-specific rash. He is hypertensive and otherwise vitals are normal. He is tolerating PO and has no trismus or fevers. Attempted to drain abscess with 18g needle but was unsuccessful. He will need dental f/u which he understands. Rx for Clindamycin and Naproxen given. Also will give steroid cream for rash. Return precautions given.  Final Clinical Impressions(s) / ED Diagnoses   Final diagnoses:  Dental abscess  Rash and nonspecific skin eruption    New Prescriptions New Prescriptions   No medications on file     Beryle QuantGekas, Nasya Vincent Marie, PA-C 06/07/17 2029    Lavera GuiseLiu, Dana Duo, MD 06/09/17 [phone]

## 2017-12-19 ENCOUNTER — Emergency Department (HOSPITAL_COMMUNITY)
Admission: EM | Admit: 2017-12-19 | Discharge: 2017-12-20 | Disposition: A | Discharge status: Other Acute Inpatient Hospital | Payer: Medicaid Other | Attending: Emergency Medicine | Admitting: Emergency Medicine

## 2017-12-19 ENCOUNTER — Encounter (HOSPITAL_COMMUNITY): Payer: Self-pay | Admitting: Emergency Medicine

## 2017-12-19 DIAGNOSIS — F1020 Alcohol dependence, uncomplicated: Secondary | ICD-10-CM | POA: Insufficient documentation

## 2017-12-19 DIAGNOSIS — F332 Major depressive disorder, recurrent severe without psychotic features: Secondary | ICD-10-CM | POA: Insufficient documentation

## 2017-12-19 DIAGNOSIS — R45851 Suicidal ideations: Secondary | ICD-10-CM

## 2017-12-19 DIAGNOSIS — F1520 Other stimulant dependence, uncomplicated: Secondary | ICD-10-CM | POA: Insufficient documentation

## 2017-12-19 DIAGNOSIS — Z79899 Other long term (current) drug therapy: Secondary | ICD-10-CM | POA: Insufficient documentation

## 2017-12-19 DIAGNOSIS — I10 Essential (primary) hypertension: Secondary | ICD-10-CM | POA: Insufficient documentation

## 2017-12-19 DIAGNOSIS — F329 Major depressive disorder, single episode, unspecified: Secondary | ICD-10-CM

## 2017-12-19 DIAGNOSIS — F17210 Nicotine dependence, cigarettes, uncomplicated: Secondary | ICD-10-CM | POA: Insufficient documentation

## 2017-12-19 DIAGNOSIS — F1220 Cannabis dependence, uncomplicated: Secondary | ICD-10-CM | POA: Insufficient documentation

## 2017-12-19 DIAGNOSIS — F1420 Cocaine dependence, uncomplicated: Secondary | ICD-10-CM | POA: Insufficient documentation

## 2017-12-19 DIAGNOSIS — F1120 Opioid dependence, uncomplicated: Secondary | ICD-10-CM | POA: Insufficient documentation

## 2017-12-19 DIAGNOSIS — F1910 Other psychoactive substance abuse, uncomplicated: Secondary | ICD-10-CM

## 2017-12-19 DIAGNOSIS — F32A Depression, unspecified: Secondary | ICD-10-CM

## 2017-12-19 LAB — COMPREHENSIVE METABOLIC PANEL
ALT: 28 U/L (ref 17–63)
AST: 29 U/L (ref 15–41)
Albumin: 4.2 g/dL (ref 3.5–5.0)
Alkaline Phosphatase: 52 U/L (ref 38–126)
Anion gap: 13 (ref 5–15)
BUN: 10 mg/dL (ref 6–20)
CHLORIDE: 102 mmol/L (ref 101–111)
CO2: 25 mmol/L (ref 22–32)
Calcium: 9.2 mg/dL (ref 8.9–10.3)
Creatinine, Ser: 1.29 mg/dL — ABNORMAL HIGH (ref 0.61–1.24)
GFR calc Af Amer: >60 mL/min (ref >60)
Glucose, Bld: 92 mg/dL (ref 65–99)
POTASSIUM: 3.9 mmol/L (ref 3.5–5.1)
Sodium: 140 mmol/L (ref 135–145)
Total Bilirubin: 0.7 mg/dL (ref 0.3–1.2)
Total Protein: 7.9 g/dL (ref 6.5–8.1)

## 2017-12-19 LAB — CBC
HCT: 41.9 % (ref 39.0–52.0)
Hemoglobin: 13.7 g/dL (ref 13.0–17.0)
MCH: 30.2 pg (ref 26.0–34.0)
MCHC: 32.7 g/dL (ref 30.0–36.0)
MCV: 92.5 fL (ref 78.0–100.0)
PLATELETS: 309 10*3/uL (ref 150–400)
RBC: 4.53 MIL/uL (ref 4.22–5.81)
RDW: 13.3 % (ref 11.5–15.5)
WBC: 7.8 10*3/uL (ref 4.0–10.5)

## 2017-12-19 LAB — SALICYLATE LEVEL

## 2017-12-19 LAB — ACETAMINOPHEN LEVEL

## 2017-12-19 LAB — ETHANOL

## 2017-12-19 MED ORDER — ZOLPIDEM TARTRATE 5 MG PO TABS: 5.0000 mg | ORAL_TABLET | Freq: Every evening | ORAL | Status: DC | PRN

## 2017-12-19 MED ORDER — NICOTINE 21 MG/24HR TD PT24: 21.0000 mg | MEDICATED_PATCH | Freq: Every day | TRANSDERMAL | Status: DC

## 2017-12-19 MED ORDER — ONDANSETRON HCL 4 MG PO TABS: 4.0000 mg | ORAL_TABLET | Freq: Three times a day (TID) | ORAL | Status: DC | PRN

## 2017-12-19 MED ORDER — ALUM & MAG HYDROXIDE-SIMETH 200-200-20 MG/5ML PO SUSP: 30.0000 mL | Freq: Four times a day (QID) | ORAL | Status: DC | PRN

## 2017-12-19 MED ORDER — ACETAMINOPHEN 325 MG PO TABS: 650.0000 mg | ORAL_TABLET | ORAL | Status: DC | PRN

## 2017-12-19 NOTE — ED Triage Notes (Addendum)
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 PT also endorses suicidal thoughts, depression, states "I feel like giving up" Went to see his probation officer who told him to come here. He has not been taking his medications. Pt states he thought about jumping off the 3rd floor at the court house or running into traffic. Also tried to OD on drugs. No weapons on self. States treatments don't work for him.

## 2017-12-19 NOTE — Progress Notes (Signed)
Pt is deemed appropriate for inpatient treatment per Donell Sievert, PA. Referral information has been sent to the following hospitals: [facility]  [facility]  [facility]  [facility]  [facility]  [facility]  [facility]  [facility]   Disposition CSW will continue to assist with placement options.  Wells Guiles, LCSW, LCAS Disposition CSW [facility] [phone]
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
## 2017-12-19 NOTE — ED Notes (Signed)
TTS completed. 

## 2017-12-19 NOTE — ED Notes (Signed)
Pt stated did not want nicotine patch on.

## 2017-12-19 NOTE — ED Notes (Signed)
Pt explained thoughts of harming himself. Stated have attempted several week ago by burning him self with cigarette buds on arm and cutting is upper arm with a knife. Pt states when he visited his PO, he thought about dropping of the 3rd story floor couple months ago. Pt admitted to drinking and substance abuse. Stated is trying to stop. 43 year old daughter expired 3 1/2 years ago and has not recovered from the situation. Pt calm, cooperative. Pt watching tv

## 2017-12-19 NOTE — ED Provider Notes (Signed)
[facility] EMERGENCY DEPARTMENT Provider Note   CSN: [id number] Arrival date & time: 12/19/17  1322     History   Chief Complaint Chief Complaint  Patient presents with  . Suicidal  . Drug Problem    HPI Eric Livingston is a 42 y.o. male.  HPI Eric Livingston is a 42 y.o. male presents to ED with depression and suicidal thoughts. Pt states he self medicates with cocaine, heroin, alcohol. States supposed to be on medications, but not taking, have been off of them for 3 weeks. Supposed to see a psychiatrist but has not recently. States he is having trouble sleeping. States lost her daughter and can hear "baby crying in my head when I try to sleep." Denies SI plan. No HI.   Past Medical History:  Diagnosis Date  . Alcohol abuse   . Depression   . Hyperlipidemia   . Hypertension   . Polysubstance abuse [facility])     Patient Active Problem List   Diagnosis Date Noted  . Essential hypertension, benign 03/25/2013  . Polysubstance dependence (HCC) 03/24/2013  . Alcohol dependence syndrome (HCC) 03/24/2013  . Substance induced mood disorder (HCC) 03/24/2013  . Suicidal thoughts 03/24/2013    Past Surgical History:  Procedure Laterality Date  . HERNIA REPAIR          Home Medications    Prior to Admission medications   Medication Sig Start Date End Date Taking? Authorizing Provider  albuterol (PROVENTIL HFA;VENTOLIN HFA) 108 (90 Base) MCG/ACT inhaler Inhale 2 puffs into the lungs every 6 (six) hours as needed for wheezing or shortness of breath.    [provider]  amoxicillin (AMOXIL) 500 MG capsule Take 1 capsule (500 mg total) by mouth 3 (three) times daily. 05/25/17   Jacalyn Lefevre, MD  benzocaine (ORAJEL) 10 % mucosal gel Use as directed 1 application in the mouth or throat as needed for mouth pain. 05/25/17   Jacalyn Lefevre, MD  clindamycin (CLEOCIN) 300 MG capsule Take 1 capsule (300 mg total) by mouth 3 (three) times daily. 06/07/17    Bethel Born, PA-C  dexamethasone (DECADRON) 1 MG tablet Take 1 tablet (1 mg total) by mouth 2 (two) times daily with a meal. Patient not taking: Reported on 11/07/2016 04/08/15   Renne Crigler, PA-C  divalproex (DEPAKOTE ER) 500 MG 24 hr tablet Take 1 tablet (500 mg total) by mouth at bedtime. For mood stabilization Patient not taking: Reported on 11/07/2016 04/02/13   Armandina Stammer I, NP  hydrochlorothiazide (HYDRODIURIL) 25 MG tablet Take 25 mg by mouth daily.    [provider]  HYDROcodone-acetaminophen (NORCO) 5-325 MG per tablet Take 1-2 tablets by mouth every 6 (six) hours as needed for severe pain. Patient not taking: Reported on 11/06/2016 03/31/15   Doug Sou, MD  hydrOXYzine (ATARAX/VISTARIL) 50 MG tablet Take 1 tablet (50 mg total) by mouth at bedtime as needed (sleep). For sleep Patient not taking: Reported on 11/07/2016 04/02/13   Armandina Stammer I, NP  ibuprofen (ADVIL,MOTRIN) 800 MG tablet Take 1 tablet (800 mg total) by mouth 3 (three) times daily. 05/25/17   Jacalyn Lefevre, MD  lisinopril (PRINIVIL,ZESTRIL) 10 MG tablet Take 1 tablet (10 mg total) by mouth daily. For high blood pressure control Patient not taking: Reported on 11/07/2016 04/02/13   Armandina Stammer I, NP  magic mouthwash SOLN Take 5 mLs by mouth 3 (three) times daily as needed for mouth pain. 03/10/17   Georgiana Shore, PA-C  Multiple Vitamin (  MULTIVITAMIN WITH MINERALS) TABS tablet Take 1 tablet by mouth daily. For vitamin supplement Patient not taking: Reported on 11/06/2016 04/03/13   Armandina Stammer I, NP  naproxen (NAPROSYN) 500 MG tablet Take 1 tablet (500 mg total) by mouth 2 (two) times daily. 06/07/17   Bethel Born, PA-C  oxyCODONE-acetaminophen (PERCOCET/ROXICET) 5-325 MG per tablet Take 1-2 tablets by mouth every 6 (six) hours as needed for severe pain. Patient not taking: Reported on 11/06/2016 04/08/15   Renne Crigler, PA-C  promethazine (PHENERGAN) 25 MG tablet Take 1 tablet (25 mg total)  by mouth every 6 (six) hours as needed for nausea or vomiting. Patient not taking: Reported on 11/07/2016 03/23/15   Pisciotta, Joni Reining, PA-C  QUEtiapine (SEROQUEL) 100 MG tablet Take 1 tablet (100 mg total) by mouth at bedtime. For mood control 04/02/13   Armandina Stammer I, NP  triamcinolone cream (KENALOG) 0.1 % Apply 1 application topically 2 (two) times daily. 06/07/17   Bethel Born, PA-C  verapamil (CALAN) 80 MG tablet Take 1 tablet (80 mg total) by mouth 3 (three) times daily. Patient not taking: Reported on 11/07/2016 03/23/15   Pisciotta, Mardella Layman    Family History No family history on file.  Social History Social History   Tobacco Use  . Smoking status: Current Every Day Smoker    Packs/day: 1.00    Years: 15.00    Pack years: 15.00    Types: Cigarettes  . Smokeless tobacco: Current User  Substance Use Topics  . Alcohol use: Yes  . Drug use: Yes    Types: Marijuana, Cocaine    Comment: ice, heroin     Allergies   Patient has no known allergies.   Review of Systems Review of Systems  Constitutional: Negative for chills and fever.  Respiratory: Negative for cough, chest tightness and shortness of breath.   Cardiovascular: Negative for chest pain, palpitations and leg swelling.  Gastrointestinal: Negative for abdominal distention, abdominal pain, diarrhea, nausea and vomiting.  Genitourinary: Negative for dysuria, frequency, hematuria and urgency.  Musculoskeletal: Negative for arthralgias, myalgias, neck pain and neck stiffness.  Skin: Negative for rash.  Allergic/Immunologic: Negative for immunocompromised state.  Neurological: Negative for dizziness, weakness, light-headedness, numbness and headaches.  Psychiatric/Behavioral: Positive for dysphoric mood and suicidal ideas. The patient is nervous/anxious.   All other systems reviewed and are negative.    Physical Exam Updated Vital Signs BP 133/82 (BP Location: Left Arm)   Pulse 84   Temp 98 F (36.7 C)  (Oral)   Resp 16   SpO2 100%   Physical Exam  Constitutional: He appears well-developed and well-nourished. No distress.  HENT:  Head: Normocephalic and atraumatic.  Eyes: Conjunctivae are normal.  Neck: Neck supple.  Cardiovascular: Normal rate, regular rhythm and normal heart sounds.  Pulmonary/Chest: Effort normal. No respiratory distress. He has no wheezes. He has no rales.  Abdominal: Soft. Bowel sounds are normal. He exhibits no distension. There is no tenderness. There is no rebound.  Musculoskeletal: He exhibits no edema.  Neurological: He is alert.  Skin: Skin is warm and dry.  Psychiatric: He exhibits a depressed mood. He expresses suicidal ideation.  Nursing note and vitals reviewed.    ED Treatments / Results  Labs (all labs ordered are listed, but only abnormal results are displayed) Labs Reviewed  COMPREHENSIVE METABOLIC PANEL - Abnormal; Notable for the following components:      Result Value   Creatinine, Ser 1.29 (*)    All other components within normal  limits  ACETAMINOPHEN LEVEL - Abnormal; Notable for the following components:   Acetaminophen (Tylenol), Serum <10 (*)    All other components within normal limits  ETHANOL  SALICYLATE LEVEL  CBC  RAPID URINE DRUG SCREEN, HOSP PERFORMED    EKG None  Radiology No results found.  Procedures Procedures (including critical care time)  Medications Ordered in ED Medications - No data to display   Initial Impression / Assessment and Plan / ED Course  I have reviewed the triage vital signs and the nursing notes.  Pertinent labs & imaging results that were available during my care of the patient were reviewed by me and considered in my medical decision making (see chart for details).     Pt in ED with complaint of SI and PTSD and depression. No Plan. Took himself off meds. Polysubstance abuse for "self medication." Unable to sleep. Will get TTS to assess. Medically cleared.   Vitals:   12/19/17  1344 12/19/17 1713 12/19/17 1910  BP: (!) 156/100 133/82 (!) 140/92  Pulse: 97 84 71  Resp: Temp: 98.6 F (37 C) 98 F (36.7 C) 98 F (36.7 C)  TempSrc:  Oral Oral  SpO2: 97% 100% 98%     Final Clinical Impressions(s) / ED Diagnoses   Final diagnoses:  None    ED Discharge Orders    None       Jaynie Crumble, PA-C 12/19/17 1953    Eber Hong, MD 12/21/17 [phone]

## 2017-12-20 ENCOUNTER — Encounter (HOSPITAL_COMMUNITY): Payer: Self-pay | Admitting: *Deleted

## 2017-12-20 ENCOUNTER — Inpatient Hospital Stay (HOSPITAL_COMMUNITY)
Admission: AD | Admit: 2017-12-20 | Discharge: 2017-12-24 | DRG: 885 | Disposition: A | Discharge status: Home / Self Care | Payer: Medicaid Other | Source: Intra-hospital | Attending: Psychiatry | Admitting: Psychiatry

## 2017-12-20 ENCOUNTER — Other Ambulatory Visit: Payer: Self-pay

## 2017-12-20 DIAGNOSIS — F332 Major depressive disorder, recurrent severe without psychotic features: Secondary | ICD-10-CM | POA: Diagnosis present

## 2017-12-20 DIAGNOSIS — F1410 Cocaine abuse, uncomplicated: Secondary | ICD-10-CM | POA: Diagnosis not present

## 2017-12-20 DIAGNOSIS — F1010 Alcohol abuse, uncomplicated: Secondary | ICD-10-CM | POA: Diagnosis present

## 2017-12-20 DIAGNOSIS — F1920 Other psychoactive substance dependence, uncomplicated: Secondary | ICD-10-CM | POA: Diagnosis present

## 2017-12-20 DIAGNOSIS — Z62810 Personal history of physical and sexual abuse in childhood: Secondary | ICD-10-CM | POA: Diagnosis present

## 2017-12-20 DIAGNOSIS — F1290 Cannabis use, unspecified, uncomplicated: Secondary | ICD-10-CM | POA: Diagnosis present

## 2017-12-20 DIAGNOSIS — Z590 Homelessness: Secondary | ICD-10-CM | POA: Diagnosis not present

## 2017-12-20 DIAGNOSIS — I10 Essential (primary) hypertension: Secondary | ICD-10-CM | POA: Diagnosis present

## 2017-12-20 DIAGNOSIS — F17210 Nicotine dependence, cigarettes, uncomplicated: Secondary | ICD-10-CM | POA: Diagnosis present

## 2017-12-20 DIAGNOSIS — Z23 Encounter for immunization: Secondary | ICD-10-CM

## 2017-12-20 DIAGNOSIS — F322 Major depressive disorder, single episode, severe without psychotic features: Secondary | ICD-10-CM | POA: Diagnosis not present

## 2017-12-20 DIAGNOSIS — F419 Anxiety disorder, unspecified: Secondary | ICD-10-CM | POA: Diagnosis not present

## 2017-12-20 DIAGNOSIS — R45851 Suicidal ideations: Secondary | ICD-10-CM | POA: Diagnosis present

## 2017-12-20 DIAGNOSIS — F1520 Other stimulant dependence, uncomplicated: Secondary | ICD-10-CM | POA: Diagnosis present

## 2017-12-20 DIAGNOSIS — F1420 Cocaine dependence, uncomplicated: Secondary | ICD-10-CM | POA: Diagnosis present

## 2017-12-20 DIAGNOSIS — Z653 Problems related to other legal circumstances: Secondary | ICD-10-CM | POA: Diagnosis not present

## 2017-12-20 LAB — RAPID URINE DRUG SCREEN, HOSP PERFORMED
Amphetamines: NOT DETECTED
BENZODIAZEPINES: NOT DETECTED
Barbiturates: NOT DETECTED
COCAINE: POSITIVE — AB
Opiates: NOT DETECTED
Tetrahydrocannabinol: NOT DETECTED

## 2017-12-20 MED ORDER — PNEUMOCOCCAL VAC POLYVALENT 25 MCG/0.5ML IJ INJ
0.5000 mL | INJECTION | INTRAMUSCULAR | Status: AC
  Administered 2017-12-21: 0.5 mL via INTRAMUSCULAR

## 2017-12-20 MED ORDER — HYDROXYZINE HCL 25 MG PO TABS
25.0000 mg | ORAL_TABLET | Freq: Four times a day (QID) | ORAL | Status: DC | PRN
  Administered 2017-12-20: 25 mg via ORAL
  Filled 2017-12-20: qty 1

## 2017-12-20 MED ORDER — LISINOPRIL 10 MG PO TABS
10.0000 mg | ORAL_TABLET | Freq: Every day | ORAL | Status: DC
Start: 2017-12-20 — End: 2017-12-20

## 2017-12-20 MED ORDER — AMLODIPINE BESYLATE 5 MG PO TABS: 5.0000 mg | ORAL_TABLET | Freq: Every day | ORAL | Status: DC

## 2017-12-20 MED ORDER — CLONIDINE HCL 0.1 MG PO TABS
0.1000 mg | ORAL_TABLET | Freq: Every day | ORAL | Status: DC
  Administered 2017-12-20: 0.1 mg via ORAL
  Filled 2017-12-20 (×3): qty 1

## 2017-12-20 MED ORDER — ALBUTEROL SULFATE HFA 108 (90 BASE) MCG/ACT IN AERS: 2.0000 | INHALATION_SPRAY | Freq: Four times a day (QID) | RESPIRATORY_TRACT | Status: DC | PRN

## 2017-12-20 MED ORDER — IBUPROFEN 600 MG PO TABS
600.0000 mg | ORAL_TABLET | Freq: Four times a day (QID) | ORAL | Status: DC | PRN
  Administered 2017-12-20 – 2017-12-23 (×2): 600 mg via ORAL
  Filled 2017-12-20 (×2): qty 1

## 2017-12-20 MED ORDER — NICOTINE 21 MG/24HR TD PT24
21.0000 mg | MEDICATED_PATCH | Freq: Every day | TRANSDERMAL | Status: DC
  Filled 2017-12-20 (×6): qty 1

## 2017-12-20 MED ORDER — AMLODIPINE BESYLATE 2.5 MG PO TABS
2.5000 mg | ORAL_TABLET | Freq: Every day | ORAL | Status: DC
  Administered 2017-12-21 – 2017-12-24 (×4): 2.5 mg via ORAL
  Filled 2017-12-20 (×6): qty 1

## 2017-12-20 MED ORDER — LISINOPRIL 10 MG PO TABS
10.0000 mg | ORAL_TABLET | Freq: Every day | ORAL | Status: DC
  Administered 2017-12-21 – 2017-12-24 (×4): 10 mg via ORAL
  Filled 2017-12-20 (×6): qty 1

## 2017-12-20 MED ORDER — LISINOPRIL 10 MG PO TABS: 10.0000 mg | ORAL_TABLET | Freq: Every day | ORAL | Status: DC

## 2017-12-20 NOTE — ED Notes (Signed)
Pt attempted to make phone call from nurses' desk. Pt aware bed assignment is ready at [facility] and may be transported to arrive there at 1800. Pt signed consent form - copy faxed to [facility], copy sent to Medical Records, and original placed in envelope for [facility].
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
## 2017-12-20 NOTE — ED Provider Notes (Signed)
Patient being accepted over at behavioral health, patient denies any acute complaints and is stable for transfer   Eric Livingston, Cordelia Poche 12/20/17 1610    Tegeler, Canary Brim, MD 12/20/17 [phone]

## 2017-12-20 NOTE — ED Notes (Signed)
Pt voiced understanding and agreement w/acceptance to [facility] possibly later today.

## 2017-12-20 NOTE — ED Notes (Signed)
Patient transported to Ultrasound 

## 2017-12-20 NOTE — ED Notes (Signed)
Report given to [facility] - requested for pt to arrive around 1815.

## 2017-12-20 NOTE — Tx Team (Signed)
Initial Treatment Plan 12/20/2017 7:40 PM Eric Livingston ZOX:[id number]    PATIENT STRESSORS: Marital or family conflict Medication change or noncompliance Substance abuse   PATIENT STRENGTHS: Ability for insight Average or above average intelligence Capable of independent living Communication skills General fund of knowledge Motivation for treatment/growth   PATIENT IDENTIFIED PROBLEMS: Depression Suicidal thoughts Substance Abuse "Get my mind back right and get away from everyone negative"                     DISCHARGE CRITERIA:  Ability to meet basic life and health needs Improved stabilization in mood, thinking, and/or behavior Reduction of life-threatening or endangering symptoms to within safe limits Verbal commitment to aftercare and medication compliance  PRELIMINARY DISCHARGE PLAN: Attend aftercare/continuing care group  PATIENT/FAMILY INVOLVEMENT: This treatment plan has been presented to and reviewed with the patient, Eric Livingston, and/or family member, .  The patient and family have been given the opportunity to ask questions and make suggestions.  Eric Livingston, [name], [name] 12/20/2017, 7:40 PM

## 2017-12-20 NOTE — Progress Notes (Signed)
Pt accepted to [facility], room #302-1. Shuvon Rankin, NP is the accepting provider.   Dr. Tamera Punt is the attending provider.   Call report to [phone].   RN [facility] ED notified.    Pt is voluntary and can be transported by [facility].  Pt is scheduled to arrive at [facility] at 6pm.   Wells Guiles, LCSW, LCAS Disposition CSW [facility] BHH/TTS [phone]

## 2017-12-20 NOTE — Progress Notes (Signed)
Patient ID: Eric Livingston, male   DOB: 09-28-1975, 42 y.o.   MRN: [id number]   Report accepted from admitting nurse, Debbe Bales, RN. Pt currently presents with a flat affect and anxious behavior. Reports ongoing back pain that has worsened lately. Requests Vistaril for sleep tonight. EKG completed, WDL and NSR. Copy placed on patients physical chart. Provider consulted. Pt has concerns that his car has been impounded in [place] and will have no transportation at discharge. Pt supported emotionally and encouraged to express concerns and questions. Pt's safety ensured with 15 minute and environmental checks. Pt currently denies SI/HI and A/V hallucinations. Pt verbally agrees to seek staff if SI/HI or A/VH occurs and to consult with staff before acting on any harmful thoughts. Will continue POC.

## 2017-12-20 NOTE — ED Notes (Signed)
Re-TTS completed.  

## 2017-12-20 NOTE — Progress Notes (Signed)
Eric Livingston is a 42 year old male pt admitted on voluntary basis. On admission, he spoke about how he has been feeling depressed and suicidal and does endorse passive SI but is able to contract for safety on the unit. He reports he was in [facility] and got discharged on 4/17 and reports that he has not gone for any follow up and reports that he has not been taking his medications like he should. He reports that when he is on his medications he feels that they work ok and cites an inability to get to appointments as reason he is not compliant with treatment. He also endorses daily usage of various substances and endorses that he has a substance abuse problem and spoke about how he may want to get into a long-term treatment program far away from here so he can get a fresh start. Eric Livingston was oriented to the unit and safety maintained.

## 2017-12-21 DIAGNOSIS — Z62810 Personal history of physical and sexual abuse in childhood: Secondary | ICD-10-CM

## 2017-12-21 DIAGNOSIS — F419 Anxiety disorder, unspecified: Secondary | ICD-10-CM

## 2017-12-21 DIAGNOSIS — F1920 Other psychoactive substance dependence, uncomplicated: Secondary | ICD-10-CM

## 2017-12-21 DIAGNOSIS — F17210 Nicotine dependence, cigarettes, uncomplicated: Secondary | ICD-10-CM

## 2017-12-21 DIAGNOSIS — F1410 Cocaine abuse, uncomplicated: Secondary | ICD-10-CM

## 2017-12-21 DIAGNOSIS — F322 Major depressive disorder, single episode, severe without psychotic features: Secondary | ICD-10-CM

## 2017-12-21 MED ORDER — CITALOPRAM HYDROBROMIDE 20 MG PO TABS
20.0000 mg | ORAL_TABLET | Freq: Every day | ORAL | Status: DC
  Administered 2017-12-21 – 2017-12-22 (×2): 20 mg via ORAL
  Filled 2017-12-21 (×5): qty 1

## 2017-12-21 MED ORDER — TRAZODONE HCL 100 MG PO TABS
100.0000 mg | ORAL_TABLET | Freq: Every evening | ORAL | Status: DC | PRN
  Filled 2017-12-21: qty 1

## 2017-12-21 MED ORDER — DIVALPROEX SODIUM ER 500 MG PO TB24
500.0000 mg | ORAL_TABLET | Freq: Every day | ORAL | Status: DC
  Administered 2017-12-21: 500 mg via ORAL
  Filled 2017-12-21 (×3): qty 1

## 2017-12-21 MED ORDER — BENZOCAINE 10 % MT GEL: Freq: Three times a day (TID) | OROMUCOSAL | Status: DC | PRN

## 2017-12-21 MED ORDER — CLONIDINE HCL 0.1 MG PO TABS
0.1000 mg | ORAL_TABLET | Freq: Every day | ORAL | Status: DC
  Administered 2017-12-22 – 2017-12-23 (×2): 0.1 mg via ORAL
  Filled 2017-12-21 (×5): qty 1

## 2017-12-21 MED ORDER — QUETIAPINE FUMARATE 100 MG PO TABS
100.0000 mg | ORAL_TABLET | Freq: Every day | ORAL | Status: DC
  Filled 2017-12-21 (×2): qty 1
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
## 2017-12-21 NOTE — Tx Team (Signed)
Interdisciplinary Treatment and Diagnostic Plan Update  12/21/2017 Time of Session: 0830AM Eric Livingston MRN: [id number]  Principal Diagnosis: MDD, single episode, severe, without psychosis  Secondary Diagnoses: Active Problems:   MDD (major depressive disorder), single episode, severe , no psychosis ([place])   Current Medications:  Current Facility-Administered Medications  Medication Dose Route Frequency Provider Last Rate Last Dose  . albuterol (PROVENTIL HFA;VENTOLIN HFA) 108 (90 Base) MCG/ACT inhaler 2 puff  2 puff Inhalation Q6H PRN Laverle Hobby, PA-C      . amLODipine (NORVASC) tablet 2.5 mg  2.5 mg Oral Daily Simon, Spencer E, PA-C      . cloNIDine (CATAPRES) tablet 0.1 mg  0.1 mg Oral QHS Patriciaann Clan E, PA-C   0.1 mg at 12/20/17 2219  . hydrOXYzine (ATARAX/VISTARIL) tablet 25 mg  25 mg Oral Q6H PRN Laverle Hobby, PA-C   25 mg at 12/20/17 2219  . ibuprofen (ADVIL,MOTRIN) tablet 600 mg  600 mg Oral Q6H PRN Laverle Hobby, PA-C   600 mg at 12/20/17 2219  . lisinopril (PRINIVIL,ZESTRIL) tablet 10 mg  10 mg Oral Daily Simon, Spencer E, PA-C      . nicotine (NICODERM CQ - dosed in mg/24 hours) patch 21 mg  21 mg Transdermal Daily Simon, Spencer E, PA-C      . pneumococcal 23 valent vaccine (PNU-IMMUNE) injection 0.5 mL  0.5 mL Intramuscular Tomorrow-1000 Mallie Darting, Cordie Grice, MD       PTA Medications: Medications Prior to Admission  Medication Sig Dispense Refill Last Dose  . albuterol (PROVENTIL HFA;VENTOLIN HFA) 108 (90 Base) MCG/ACT inhaler Inhale 2 puffs into the lungs every 6 (six) hours as needed for wheezing or shortness of breath.   12/06/2017  . amoxicillin (AMOXIL) 500 MG capsule Take 1 capsule (500 mg total) by mouth 3 (three) times daily. (Patient not taking: Reported on 12/20/2017) 21 capsule 0 Not Taking at Unknown time  . benzocaine (ORAJEL) 10 % mucosal gel Use as directed 1 application in the mouth or throat as needed for mouth pain. (Patient not taking: Reported  on 12/20/2017) 5.3 g 0 Not Taking at Unknown time  . dexamethasone (DECADRON) 1 MG tablet Take 1 tablet (1 mg total) by mouth 2 (two) times daily with a meal. (Patient not taking: Reported on 11/07/2016) 10 tablet 0 Not Taking at Unknown time  . divalproex (DEPAKOTE ER) 500 MG 24 hr tablet Take 1 tablet (500 mg total) by mouth at bedtime. For mood stabilization 30 tablet 0   . hydrochlorothiazide (HYDRODIURIL) 25 MG tablet Take 25 mg by mouth daily.   12/17/2017  . HYDROcodone-acetaminophen (NORCO) 5-325 MG per tablet Take 1-2 tablets by mouth every 6 (six) hours as needed for severe pain. (Patient not taking: Reported on 11/06/2016) 8 tablet 0 Not Taking at Unknown time  . hydrOXYzine (ATARAX/VISTARIL) 50 MG tablet Take 1 tablet (50 mg total) by mouth at bedtime as needed (sleep). For sleep (Patient not taking: Reported on 11/07/2016) 30 tablet 0 Not Taking at Unknown time  . ibuprofen (ADVIL,MOTRIN) 200 MG tablet Take 800 mg by mouth as needed for mild pain.   Past Week at Unknown time  . lisinopril (PRINIVIL,ZESTRIL) 10 MG tablet Take 1 tablet (10 mg total) by mouth daily. For high blood pressure control (Patient not taking: Reported on 11/07/2016) 30 tablet 0 Not Taking at Unknown time  . Multiple Vitamin (MULTIVITAMIN WITH MINERALS) TABS tablet Take 1 tablet by mouth daily. For vitamin supplement   Past Week at Unknown  time  . oxyCODONE-acetaminophen (PERCOCET/ROXICET) 5-325 MG per tablet Take 1-2 tablets by mouth every 6 (six) hours as needed for severe pain. (Patient not taking: Reported on 11/06/2016) 8 tablet 0 Not Taking at Unknown time  . QUEtiapine (SEROQUEL) 100 MG tablet Take 1 tablet (100 mg total) by mouth at bedtime. For mood control (Patient not taking: Reported on 12/20/2017) 30 tablet 0 Not Taking at Unknown time  . triamcinolone cream (KENALOG) 0.1 % Apply 1 application topically 2 (two) times daily. (Patient not taking: Reported on 12/20/2017) 30 g 0 Not Taking at Unknown time  . verapamil  (CALAN-SR) 120 MG CR tablet Take 120 mg by mouth daily.   12/17/2017    Patient Stressors: Marital or family conflict Medication change or noncompliance Substance abuse  Patient Strengths: Ability for insight Average or above average intelligence Capable of independent living Communication skills General fund of knowledge Motivation for treatment/growth  Treatment Modalities: Medication Management, Group therapy, Case management,  1 to 1 session with clinician, Psychoeducation, Recreational therapy.   Physician Treatment Plan for Primary Diagnosis: MDD, single episode, severe, without psychosis   Medication Management: Evaluate patient's response, side effects, and tolerance of medication regimen.  Therapeutic Interventions: 1 to 1 sessions, Unit Group sessions and Medication administration.  Evaluation of Outcomes: Not Met  Physician Treatment Plan for Secondary Diagnosis: Active Problems:   MDD (major depressive disorder), single episode, severe , no psychosis ([place])   Medication Management: Evaluate patient's response, side effects, and tolerance of medication regimen.  Therapeutic Interventions: 1 to 1 sessions, Unit Group sessions and Medication administration.  Evaluation of Outcomes: Not Met   RN Treatment Plan for Primary Diagnosis: MDD, single episode, severe, without psychosis  Long Term Goal(s): Knowledge of disease and therapeutic regimen to maintain health will improve  Short Term Goals: Ability to remain free from injury will improve, Ability to disclose and discuss suicidal ideas and Ability to identify and develop effective coping behaviors will improve  Medication Management: RN will administer medications as ordered by provider, will assess and evaluate patient's response and provide education to patient for prescribed medication. RN will report any adverse and/or side effects to prescribing provider.  Therapeutic Interventions: 1 on 1 counseling sessions,  Psychoeducation, Medication administration, Evaluate responses to treatment, Monitor vital signs and CBGs as ordered, Perform/monitor CIWA, COWS, AIMS and Fall Risk screenings as ordered, Perform wound care treatments as ordered.  Evaluation of Outcomes: Not Met   LCSW Treatment Plan for Primary Diagnosis: MDD, single episode, severe, without psychosis  Long Term Goal(s): Safe transition to appropriate next level of care at discharge, Engage patient in therapeutic group addressing interpersonal concerns.  Short Term Goals: Engage patient in aftercare planning with referrals and resources, Facilitate patient progression through stages of change regarding substance use diagnoses and concerns and Identify triggers associated with mental health/substance abuse issues  Therapeutic Interventions: Assess for all discharge needs, 1 to 1 time with Social worker, Explore available resources and support systems, Assess for adequacy in community support network, Educate family and significant other(s) on suicide prevention, Complete Psychosocial Assessment, Interpersonal group therapy.  Evaluation of Outcomes: Not Met   Progress in Treatment: Attending groups: No. New to unit. Continuing to assess.  Participating in groups: No. Taking medication as prescribed: Yes. Toleration medication: Yes. Family/Significant other contact made: No, will contact:  family member if patient consents to collateral contact.  Patient understands diagnosis: Yes. Discussing patient identified problems/goals with staff: Yes. Medical problems stabilized or resolved: Yes. Denies suicidal/homicidal  ideation: Yes. Issues/concerns per patient self-inventory: No. Other: n/a   New problem(s) identified: No, Describe:  n/a  New Short Term/Long Term Goal(s): detox, medication management for mood stabilization; elimination of SI thoughts; development of comprehensive mental wellness/sobriety plan.   Patient Goal: "To get into a  long term program for substance abuse."   Discharge Plan or Barriers: CSW assessing for appropriate referrals. Pt was recently kicked out of [employer] due to missing curfew. Pt is on probation. Pt just [facility] and does not remember medications. He was living in car but car was towed.   Reason for Continuation of Hospitalization: Anxiety Depression Medication stabilization Suicidal ideation Withdrawal symptoms  Estimated Length of Stay: Monday, 12/26/17  Attendees: Patient: Eric Livingston 12/21/2017 8:49 AM  Physician: Dr. Mallie Darting MD; Dr. Nancy Fetter MD 12/21/2017 8:49 AM  Nursing: Mary Sella RN; Santiago Glad RN 12/21/2017 8:49 AM  RN Care Manager:x 12/21/2017 8:49 AM  Social Worker: Maxie Better, LCSW 12/21/2017 8:49 AM  Recreational Therapist: x 12/21/2017 8:49 AM  Other: Lindell Spar NP; Waylan Boga NP 12/21/2017 8:49 AM  Other:  12/21/2017 8:49 AM  Other: 12/21/2017 8:49 AM    Scribe for Treatment Team: [place], LCSW 12/21/2017 8:49 AM

## 2017-12-21 NOTE — Progress Notes (Signed)
Patient ID: Eric Livingston, male   DOB: 1976-06-09, 42 y.o.   MRN: [id number]  Pt currently presents with a flat affect and guarded behavior. Pt forwards little to [employer]. Pt seen interacting positively with peers. Pt attended group but left early tonight. Pt refused all sleep aids and other medications.   Pt's labs and vitals were monitored throughout the night. Pt given a 1:1 about emotional and mental status. Pt supported and encouraged to express concerns and questions.   Pt's safety ensured with 15 minute and environmental checks. Pt currently denies SI/HI and A/V hallucinations. Pt verbally agrees to seek staff if SI/HI or A/VH occurs and to consult with staff before acting on any harmful thoughts. Will continue POC.

## 2017-12-21 NOTE — Progress Notes (Signed)
Recreation Therapy Notes  Date: 5.8.19 Time: 0930 Location: 300 [address] Dayroom  Group Topic: Stress Management  Goal Area(s) Addresses:  Patient will verbalize importance of using healthy stress management.  Patient will identify positive emotions associated with healthy stress management.   Intervention: Stress Management  Activity :  Meditation.  LRT introduced the stress management technique of meditation.  LRT played a meditation that focused on the strength and resilience of mountains and how those characteristics can be used in daily life.  Patients were to follow along as meditation played.  Education:  Stress Management, Discharge Planning.   Education Outcome: Acknowledges edcuation/In group clarification offered/Needs additional education  Clinical Observations/Feedback: Pt did not attend group.      Tyronica Truxillo Linday, LRT/CTRS         Geffrey Michaelsen A 12/21/2017 11:44 AM 

## 2017-12-21 NOTE — Plan of Care (Signed)
D: Eric Livingston denied SI, HI, and AVH today. He rated his depression, anxiety, and feelings of hopelessness all 10/10. He said he felt irritable but he was not going to take it out on others. He interacted with peers and attended groups. On his self inventory form, he reported poor sleep, poor appetite, low energy level, and poor concentration.   A: Meds given as ordered. Q15 safety checks maintained. Support/encouragement offered.  R: Pt remains free from harm and continues with treatment. Will continue to monitor for needs/safety.   Problem: Coping: Goal: Ability to demonstrate self-control will improve Outcome: Progressing   Problem: Physical Regulation: Goal: Ability to maintain clinical measurements within normal limits will improve Outcome: Progressing   Problem: Safety: Goal: Periods of time without injury will increase Outcome: Progressing   Problem: Medication: Goal: Compliance with prescribed medication regimen will improve Outcome: Progressing   Problem: Self-Concept: Goal: Ability to disclose and discuss suicidal ideas will improve Outcome: Progressing   Problem: Health Behavior/Discharge Planning: Goal: Compliance with therapeutic regimen will improve Outcome: Progressing

## 2017-12-21 NOTE — [facility] Group Notes (Signed)
LCSW Group Therapy Note 12/21/2017 12:59 PM  Type of Therapy/Topic: Group Therapy: Emotion Regulation  Participation Level: Active   Description of Group:  The purpose of this group is to assist patients in learning to regulate negative emotions and experience positive emotions. Patients will be guided to discuss ways in which they have been vulnerable to their negative emotions. These vulnerabilities will be juxtaposed with experiences of positive emotions or situations, and patients will be challenged to use positive emotions to combat negative ones. Special emphasis will be placed on coping with negative emotions in conflict situations, and patients will process healthy conflict resolution skills.  Therapeutic Goals: 1. Patient will identify two positive emotions or experiences to reflect on in order to balance out negative emotions 2. Patient will label two or more emotions that they find the most difficult to experience 3. Patient will demonstrate positive conflict resolution skills through discussion and/or role plays  Summary of Patient Progress:  Eric Livingston came to the group during the last 10 minutes, however he did contribute to the discussion during his stay. Eric Livingston reports that he has learned how to regulate his emotions by processing his thoughts. Eric Livingston states that the situation that caused him to come into the hospital is what taught him that he needed to be the bigger person and not react negatively to his triggers.    Therapeutic Modalities:  Cognitive Behavioral Therapy Feelings Identification Dialectical Behavioral Therapy   Alcario Drought Clinical Social Worker

## 2017-12-21 NOTE — [facility] Suicide Risk Assessment (Signed)
[facility] INPATIENT:  Family/Significant Other Suicide Prevention Education  Suicide Prevention Education:  Patient Refusal for Family/Significant Other Suicide Prevention Education: The patient Eric Livingston has refused to provide written consent for family/significant other to be provided Family/Significant Other Suicide Prevention Education during admission and/or prior to discharge.  Physician notified.  SPE completed with pt, as pt refused to consent to family contact. SPI pamphlet provided to pt and pt was encouraged to share information with support network, ask questions, and talk about any concerns relating to SPE. Pt denies access to guns/firearms and verbalized understanding of information provided. Mobile Crisis information also provided to pt.   Florice Hindle N Smart LCSW 12/21/2017, 12:49 PM

## 2017-12-21 NOTE — Progress Notes (Signed)
Pt attended goals group today and participated. Pt was slight agitated. He stated that his goal for the day to is stay "sucka free".
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
## 2017-12-22 MED ORDER — DIVALPROEX SODIUM ER 250 MG PO TB24
750.0000 mg | ORAL_TABLET | Freq: Every day | ORAL | Status: DC
  Administered 2017-12-22 – 2017-12-23 (×2): 750 mg via ORAL
  Filled 2017-12-22 (×4): qty 3

## 2017-12-22 MED ORDER — QUETIAPINE FUMARATE 200 MG PO TABS
200.0000 mg | ORAL_TABLET | Freq: Every day | ORAL | Status: DC
  Administered 2017-12-22: 200 mg via ORAL
  Filled 2017-12-22 (×2): qty 1

## 2017-12-22 MED ORDER — CITALOPRAM HYDROBROMIDE 20 MG PO TABS
20.0000 mg | ORAL_TABLET | Freq: Every day | ORAL | Status: DC
  Administered 2017-12-23 – 2017-12-24 (×2): 20 mg via ORAL
  Filled 2017-12-22 (×4): qty 1

## 2017-12-22 NOTE — Progress Notes (Signed)
D:  Patient's self inventory sheet, patient has poor sleep, no sleep medication.  Withdrawals, tremors, diarrhea, chilling.  SI "a little, off and on".  Has back pain.  Physical pain.  No pain medication.  Will think about his goal and plans. A:  Medications administered per MD orders.  Emotional support and encouragement given patient. R:  Denied HI.  Denied A/V hallucinations.  SI, no plan, contracts.  Patient stated he had nightmares last night to kill himself.  Stated he is very depressed.  "It's hard to fix things".  Stated he does not have a support system.   Only thing his girlfriend wants is his money.  Patient confirmed that he will talk to staff if he does feel he wants to hurt himself.  Patient has been cooperative and pleasant today.

## 2017-12-22 NOTE — Progress Notes (Signed)
Pt attended group this evening. 

## 2017-12-22 NOTE — Progress Notes (Signed)
Patient ID: Eric Livingston, male   DOB: 1976/03/01, 42 y.o.   MRN: [id number]  Pt currently presents with an irritable affect and cooperative behavior. Pt reports ongoing tooth pain. Interacts positively with peers, chooses to attend group. Pt responses to questions are superficial. Reports waiting on long term treatment placement. Pt reports good sleep with current medication regimen.   Pt provided with medications per providers orders. Pt's labs and vitals were monitored throughout the night. Pt given a 1:1 about emotional and mental status. Pt supported and encouraged to express concerns and questions. Pt educated on medications.  Pt's safety ensured with 15 minute and environmental checks. Pt currently denies SI/HI and A/V hallucinations. Pt verbally agrees to seek staff if SI/HI or A/VH occurs and to consult with staff before acting on any harmful thoughts. Will continue POC.

## 2017-12-22 NOTE — Progress Notes (Signed)
Patient did not attend the evening speaker NA meeting. Pt was notified that group was beginning but returned to his room.   

## 2017-12-22 NOTE — B[facility] Group Notes (Signed)
Adult Psychoeducational Group Note  Date:  12/22/2017 Time:  9:27 AM  Group Topic/Focus:  Goals Group:   The focus of this group is to help patients establish daily goals to achieve during treatment and discuss how the patient can incorporate goal setting into their daily lives to aide in recovery.  Participation Level:  Active  Participation Quality:  Appropriate  Affect:  Appropriate  Cognitive:  Alert  Insight: Appropriate  Engagement in Group:  Engaged  Modes of Intervention:  Orientation  Additional Comments:  Pt attended and participated in orientation/goals group. Pt goal for today is to say goodbye to everyone and move to [place] once he's discharged from the hospital.   [name] 12/22/2017, 9:27 AM

## 2017-12-22 NOTE — Progress Notes (Signed)
[facility] MD Progress Note  12/22/2017 10:51 AM Eric Livingston  MRN:  [id number] Subjective: Patient is seen and examined.  Patient's 42 year old male with a past psychiatric history significant for major depression as well as possible bipolar disorder and polysubstance dependence.  He is seen in follow-up.  He is doing a little bit better today.  He still remains quite irritable, but is apologetic for his behavior yesterday.  He continues to focus on trying to get himself "straightened out".  He ruminates about having made bad decisions.  He is having some pain in his right shoulder where he received a pneumonia shot, and he stated he did not sleep well last night.  We discussed the possibility of increasing the Seroquel as well as his Depakote.  He denied any side effects from these medications.  He still has some fleeting suicidal ideation. Principal Problem: MDD (major depressive disorder), single episode, severe , no psychosis (HCC) Diagnosis:   Patient Active Problem List   Diagnosis Date Noted  . MDD (major depressive disorder), single episode, severe , no psychosis (HCC) [F32.2] 12/20/2017  . Essential hypertension, benign [I10] 03/25/2013  . Polysubstance dependence (HCC) [F19.20] 03/24/2013  . Alcohol dependence syndrome (HCC) [F10.20] 03/24/2013  . Substance induced mood disorder (HCC) [F19.94] 03/24/2013  . Suicidal thoughts [R45.851] 03/24/2013   Total Time spent with patient: 20 minutes  Past Psychiatric History: See admission H&P  Past Medical History:  Past Medical History:  Diagnosis Date  . Alcohol abuse   . Depression   . Hyperlipidemia   . Hypertension   . Polysubstance abuse [facility])     Past Surgical History:  Procedure Laterality Date  . HERNIA REPAIR     Family History: History reviewed. No pertinent family history. Family Psychiatric  History: See admission H&P Social History:  Social History   Substance and Sexual Activity  Alcohol Use Yes   Comment: daily      Social History   Substance and Sexual Activity  Drug Use Yes  . Types: Marijuana, Cocaine   Comment: ice, heroin    Social History   Socioeconomic History  . Marital status: Single    Spouse name: Not on file  . Number of children: Not on file  . Years of education: Not on file  . Highest education level: Not on file  Occupational History  . Not on file  Social Needs  . Financial resource strain: Not on file  . Food insecurity:    Worry: Not on file    Inability: Not on file  . Transportation needs:    Medical: Not on file    Non-medical: Not on file  Tobacco Use  . Smoking status: Current Every Day Smoker    Packs/day: 1.00    Years: 15.00    Pack years: 15.00    Types: Cigarettes  . Smokeless tobacco: Current User  Substance and Sexual Activity  . Alcohol use: Yes    Comment: daily  . Drug use: Yes    Types: Marijuana, Cocaine    Comment: ice, heroin  . Sexual activity: Yes    Birth control/protection: None  Lifestyle  . Physical activity:    Days per week: Not on file    Minutes per session: Not on file  . Stress: Not on file  Relationships  . Social connections:    Talks on phone: Not on file    Gets together: Not on file    Attends religious service: Not on file  Active member of club or organization: Not on file    Attends meetings of clubs or organizations: Not on file    Relationship status: Not on file  Other Topics Concern  . Not on file  Social History Narrative  . Not on file   Additional Social History:                         Sleep: Fair  Appetite:  Fair  Current Medications: Current Facility-Administered Medications  Medication Dose Route Frequency Provider Last Rate Last Dose  . albuterol (PROVENTIL HFA;VENTOLIN HFA) 108 (90 Base) MCG/ACT inhaler 2 puff  2 puff Inhalation Q6H PRN Kerry Hough, PA-C      . amLODipine (NORVASC) tablet 2.5 mg  2.5 mg Oral Daily Donell Sievert E, PA-C   2.5 mg at 12/22/17 0754  .  benzocaine (ORAJEL) 10 % mucosal gel   Mouth/Throat TID PRN Charm Rings, NP      . citalopram (CELEXA) tablet 20 mg  20 mg Oral Daily Antonieta Pert, MD   20 mg at 12/22/17 0755  . cloNIDine (CATAPRES) tablet 0.1 mg  0.1 mg Oral QHS Antonieta Pert, MD      . divalproex (DEPAKOTE ER) 24 hr tablet 750 mg  750 mg Oral QHS Antonieta Pert, MD      . hydrOXYzine (ATARAX/VISTARIL) tablet 25 mg  25 mg Oral Q6H PRN Donell Sievert E, PA-C   25 mg at 12/20/17 2219  . ibuprofen (ADVIL,MOTRIN) tablet 600 mg  600 mg Oral Q6H PRN Kerry Hough, PA-C   600 mg at 12/20/17 2219  . lisinopril (PRINIVIL,ZESTRIL) tablet 10 mg  10 mg Oral Daily Kerry Hough, PA-C   10 mg at 12/22/17 0755  . nicotine (NICODERM CQ - dosed in mg/24 hours) patch 21 mg  21 mg Transdermal Daily Simon, Spencer E, PA-C      . QUEtiapine (SEROQUEL) tablet 200 mg  200 mg Oral QHS Antonieta Pert, MD      . traZODone (DESYREL) tablet 100 mg  100 mg Oral QHS PRN Charm Rings, NP        Lab Results: No results found for this or any previous visit (from the past 48 hour(s)).  Blood Alcohol level:  Lab Results  Component Value Date   ETH <10 12/19/2017   ETH 182 (H) 11/06/2016    Metabolic Disorder Labs: No results found for: HGBA1C, MPG No results found for: PROLACTIN No results found for: CHOL, TRIG, HDL, CHOLHDL, VLDL, LDLCALC  Physical Findings: AIMS: Facial and Oral Movements Muscles of Facial Expression: None, normal Lips and Perioral Area: None, normal Jaw: None, normal Tongue: None, normal,Extremity Movements Upper (arms, wrists, hands, fingers): None, normal Lower (legs, knees, ankles, toes): None, normal, Trunk Movements Neck, shoulders, hips: None, normal, Overall Severity Severity of abnormal movements (highest score from questions above): None, normal Incapacitation due to abnormal movements: None, normal Patient's awareness of abnormal movements (rate only patient's report): No Awareness,  Dental Status Current problems with teeth and/or dentures?: Yes Does patient usually wear dentures?: No  CIWA:    COWS:     Musculoskeletal: Strength & Muscle Tone: within normal limits Gait & Station: normal Patient leans: N/A  Psychiatric Specialty Exam: Physical Exam  Nursing note and vitals reviewed. Constitutional: He is oriented to person, place, and time. He appears well-developed and well-nourished.  HENT:  Head: Normocephalic and atraumatic.  Respiratory: Effort normal.  Neurological: He is alert and oriented to person, place, and time.    ROS  Blood pressure 120/79, pulse 92, temperature 98.5 F (36.9 C), temperature source Oral, resp. rate 18, height  (1.88 m), weight 114.8 kg (253 lb), SpO2 100 %.Body mass index is 32.48 kg/m.  General Appearance: Disheveled  Eye Contact:  Fair  Speech:  Normal Rate  Volume:  Decreased  Mood:  Dysphoric  Affect:  Congruent  Thought Process:  Coherent  Orientation:  Full (Time, Place, and Person)  Thought Content:  Logical  Suicidal Thoughts:  Yes.  without intent/plan  Homicidal Thoughts:  No  Memory:  Immediate;   Fair  Judgement:  Impaired  Insight:  Lacking  Psychomotor Activity:  Increased  Concentration:  Concentration: Fair  Recall:  [employer] of Knowledge:  Fair  Language:  Fair  Akathisia:  Negative  Handed:  Right  AIMS (if indicated):     Assets:  Communication Skills Desire for Improvement Physical Health Resilience  ADL's:  Intact  Cognition:  WNL  Sleep:  Number of Hours: 5.75     Treatment Plan Summary: Daily contact with patient to assess and evaluate symptoms and progress in treatment, Medication management and Plan Patient seen and examined.  Patient is a 42 year old male with the above-stated past psychiatric history seen in follow-up.  He remains depressed, dysphoric, and still withdrawing from substances.  He is not sleeping well.  I am can increase his Seroquel to 200 mg p.o. nightly, and  increase his Depakote ER to 750 mg p.o. nightly.  No other changes in his medications at this time.  Antonieta Pert, MD 12/22/2017, 10:51 AM

## 2017-12-22 NOTE — Plan of Care (Signed)
Nurse discussed depression, anxiety, coping skills with patient.  

## 2017-12-23 MED ORDER — QUETIAPINE FUMARATE 400 MG PO TABS
400.0000 mg | ORAL_TABLET | Freq: Every day | ORAL | Status: DC
  Administered 2017-12-23: 400 mg via ORAL
  Filled 2017-12-23 (×3): qty 1

## 2017-12-23 NOTE — Progress Notes (Signed)
  [facility] Adult Case Management Discharge Plan :  Will you be returning to the same living situation after discharge:  No. Pt plans to enter [facility] at discharge.  At discharge, do you have transportation home?: Yes,  bus or [place]--PER DR. CLARY, PT IS SCHEDULED FOR DISCHARGE ON Saturday, 5/10  Do you have the ability to pay for your medications: Yes,  medicaid  Release of information consent forms completed and submitted to medical records by CSW.  Patient to Follow up at: Follow-up Information    [facility] Follow up on 12/29/2017.   Specialty:  Behavioral Health Why:  Hospital follow-up on Thursday, 5/16 at 8:00AM. Please bring photo ID and Medicaid card if you have it to this appt. Thank you.  Contact information: [address] [place] [address] [phone]           Next level of care provider has access to [facility] Link:no  Safety Planning and Suicide Prevention discussed: Yes,  Pt declined to consent to family contact. SPI pamphlet and Mobile Crisis information provided to pt.   Have you used any form of tobacco in the last 30 days? (Cigarettes, Smokeless Tobacco, Cigars, and/or Pipes): Yes  Has patient been referred to the Quitline?: Patient refused referral  Patient has been referred for addiction treatment: Yes  [employer], LCSW 12/23/2017, 11:55 AM

## 2017-12-23 NOTE — [facility] Group Notes (Signed)
LCSW Group Therapy Note 12/23/2017 11:14 AM  Type of Therapy and Topic: Group Therapy: Avoiding Self-Sabotaging and Enabling Behaviors  Participation Level: Active  Description of Group:  In this group, patients will learn how to identify obstacles, self-sabotaging and enabling behaviors, as well as: what are they, why do we do them and what needs these behaviors meet. Discuss unhealthy relationships and how to have positive healthy boundaries with those that sabotage and enable. Explore aspects of self-sabotage and enabling in yourself and how to limit these self-destructive behaviors in everyday life.  Therapeutic Goals: 1. Patient will identify one obstacle that relates to self-sabotage and enabling behaviors 2. Patient will identify one personal self-sabotaging or enabling behavior they did prior to admission 3. Patient will state a plan to change the above identified behavior 4. Patient will demonstrate ability to communicate their needs through discussion and/or role play.   Summary of Patient Progress:  Eric Livingston was engaged and participated throughout the group. Eric Livingston reports his self sabotaging behavior is "messing with a married woman". Eric Livingston states that he plans to return to [place], so that he can move on with his life.     Therapeutic Modalities:  Cognitive Behavioral Therapy Person-Centered Therapy Motivational Interviewing   Baldo Daub LCSWA Clinical Social Worker

## 2017-12-23 NOTE — Progress Notes (Signed)
Recreation Therapy Notes  Date: 5.10.19 Time: 0930 Location: [address] Dayroom  Group Topic: Stress Management  Goal Area(s) Addresses:  Patient will verbalize importance of using healthy stress management.  Patient will identify positive emotions associated with healthy stress management.   Behavioral Response: Engaged  Intervention: Stress Management  Activity : Body Scan Meditation.  LRT played meditation that allowed patients to take note of any sensations and feelings they may have been experiencing throughout their bodies.  Patients were to follow along as meditation played.  Education:  Stress Management, Discharge Planning.   Education Outcome: Acknowledges edcuation/In group clarification offered/Needs additional education  Clinical Observations/Feedback: Pt attended group.    Caroll Rancher, LRT/CTRS     Caroll Rancher A 12/23/2017 12:27 PM

## 2017-12-23 NOTE — Progress Notes (Signed)
D:  Eric Livingston has been up and visible on the unit.  He has not been attending groups.  He is complaining of back pain that is chronic and ibuprofen was given with minimal relief.  "I am used to my back hurting."  He completed his self inventory and reported that his depression, hopeless and anxiety are all 9/10(10 the worst).  He was unable to report his goal for today even after talking with him today.  He denies any SI/HI or A/V hallucinations.  He did report that he would contract for safety on the unit.    A:  1:1 with RN for support and encouragement.  Medications as ordered.  Q 15 minute checks maintained for safety.  Encouraged participation in group and unit activities.   R:  Eric Livingston remains safe on the unit.  We will continue to monitor the progress towards his goals.

## 2017-12-23 NOTE — Progress Notes (Signed)
[facility] MD Progress Note  12/23/2017 10:36 AM Eric Livingston  MRN:  [id number] Subjective: Patient is seen and examined.  Patient's 42 year old male with a past psychiatric history significant for major depression as well as possible bipolar disorder and polysubstance dependence.  He seen in follow-up.  He continues to slowly improve.  His mood is better today.  He [facility].  He is planning on going there tomorrow.  He had a long talk with his brother in [place], and he stated that as soon as he is off parole he is going to go back to [place] "get himself straightened out".  He is sleep is improved somewhat, but not great.  We discussed increasing his Seroquel dosage.  We discussed getting his lab work done before he leaves the hospital tomorrow to make sure about his CBC as well as liver function enzymes.  He denied any suicidal ideation. Principal Problem: MDD (major depressive disorder), single episode, severe , no psychosis (HCC) Diagnosis:   Patient Active Problem List   Diagnosis Date Noted  . MDD (major depressive disorder), single episode, severe , no psychosis (HCC) [F32.2] 12/20/2017  . Essential hypertension, benign [I10] 03/25/2013  . Polysubstance dependence (HCC) [F19.20] 03/24/2013  . Alcohol dependence syndrome (HCC) [F10.20] 03/24/2013  . Substance induced mood disorder (HCC) [F19.94] 03/24/2013  . Suicidal thoughts [R45.851] 03/24/2013   Total Time spent with patient: 20 minutes  Past Psychiatric History: See admission H&P  Past Medical History:  Past Medical History:  Diagnosis Date  . Alcohol abuse   . Depression   . Hyperlipidemia   . Hypertension   . Polysubstance abuse [facility])     Past Surgical History:  Procedure Laterality Date  . HERNIA REPAIR     Family History: History reviewed. No pertinent family history. Family Psychiatric  History: See admission H&P Social History:  Social History   Substance and Sexual Activity   Alcohol Use Yes   Comment: daily     Social History   Substance and Sexual Activity  Drug Use Yes  . Types: Marijuana, Cocaine   Comment: ice, heroin    Social History   Socioeconomic History  . Marital status: Single    Spouse name: Not on file  . Number of children: Not on file  . Years of education: Not on file  . Highest education level: Not on file  Occupational History  . Not on file  Social Needs  . Financial resource strain: Not on file  . Food insecurity:    Worry: Not on file    Inability: Not on file  . Transportation needs:    Medical: Not on file    Non-medical: Not on file  Tobacco Use  . Smoking status: Current Every Day Smoker    Packs/day: 1.00    Years: 15.00    Pack years: 15.00    Types: Cigarettes  . Smokeless tobacco: Current User  Substance and Sexual Activity  . Alcohol use: Yes    Comment: daily  . Drug use: Yes    Types: Marijuana, Cocaine    Comment: ice, heroin  . Sexual activity: Yes    Birth control/protection: None  Lifestyle  . Physical activity:    Days per week: Not on file    Minutes per session: Not on file  . Stress: Not on file  Relationships  . Social connections:    Talks on phone: Not on file    Gets together:  Not on file    Attends religious service: Not on file    Active member of club or organization: Not on file    Attends meetings of clubs or organizations: Not on file    Relationship status: Not on file  Other Topics Concern  . Not on file  Social History Narrative  . Not on file   Additional Social History:                         Sleep: Fair  Appetite:  Good  Current Medications: Current Facility-Administered Medications  Medication Dose Route Frequency Provider Last Rate Last Dose  . albuterol (PROVENTIL HFA;VENTOLIN HFA) 108 (90 Base) MCG/ACT inhaler 2 puff  2 puff Inhalation Q6H PRN Kerry Hough, PA-C      . amLODipine (NORVASC) tablet 2.5 mg  2.5 mg Oral Daily Donell Sievert E,  PA-C   2.5 mg at 12/23/17 0805  . benzocaine (ORAJEL) 10 % mucosal gel   Mouth/Throat TID PRN Charm Rings, NP      . citalopram (CELEXA) tablet 20 mg  20 mg Oral Daily Antonieta Pert, MD   20 mg at 12/23/17 0805  . cloNIDine (CATAPRES) tablet 0.1 mg  0.1 mg Oral QHS Antonieta Pert, MD   0.1 mg at 12/22/17 2246  . divalproex (DEPAKOTE ER) 24 hr tablet 750 mg  750 mg Oral QHS Antonieta Pert, MD   750 mg at 12/22/17 2246  . hydrOXYzine (ATARAX/VISTARIL) tablet 25 mg  25 mg Oral Q6H PRN Kerry Hough, PA-C   25 mg at 12/20/17 2219  . ibuprofen (ADVIL,MOTRIN) tablet 600 mg  600 mg Oral Q6H PRN Kerry Hough, PA-C   600 mg at 12/23/17 2130  . lisinopril (PRINIVIL,ZESTRIL) tablet 10 mg  10 mg Oral Daily Kerry Hough, PA-C   10 mg at 12/23/17 0805  . nicotine (NICODERM CQ - dosed in mg/24 hours) patch 21 mg  21 mg Transdermal Daily Simon, Spencer E, PA-C      . QUEtiapine (SEROQUEL) tablet 400 mg  400 mg Oral QHS Antonieta Pert, MD      . traZODone (DESYREL) tablet 100 mg  100 mg Oral QHS PRN Charm Rings, NP        Lab Results: No results found for this or any previous visit (from the past 48 hour(s)).  Blood Alcohol level:  Lab Results  Component Value Date   ETH <10 12/19/2017   ETH 182 (H) 11/06/2016    Metabolic Disorder Labs: No results found for: HGBA1C, MPG No results found for: PROLACTIN No results found for: CHOL, TRIG, HDL, CHOLHDL, VLDL, LDLCALC  Physical Findings: AIMS: Facial and Oral Movements Muscles of Facial Expression: None, normal Lips and Perioral Area: None, normal Jaw: None, normal Tongue: None, normal,Extremity Movements Upper (arms, wrists, hands, fingers): None, normal Lower (legs, knees, ankles, toes): None, normal, Trunk Movements Neck, shoulders, hips: None, normal, Overall Severity Severity of abnormal movements (highest score from questions above): None, normal Incapacitation due to abnormal movements: None,  normal Patient's awareness of abnormal movements (rate only patient's report): No Awareness, Dental Status Current problems with teeth and/or dentures?: Yes Does patient usually wear dentures?: No  CIWA:  CIWA-Ar Total: 1 COWS:  COWS Total Score: 2  Musculoskeletal: Strength & Muscle Tone: within normal limits Gait & Station: normal Patient leans: N/A  Psychiatric Specialty Exam: Physical Exam  Nursing note and vitals reviewed. Constitutional: He  is oriented to person, place, and time. He appears well-developed and well-nourished.  HENT:  Head: Atraumatic.  Respiratory: Effort normal.  Musculoskeletal: Normal range of motion.  Neurological: He is alert and oriented to person, place, and time.    ROS  Blood pressure (!) 129/102, pulse 88, temperature 98.1 F (36.7 C), temperature source Oral, resp. rate 18, height  (1.88 m), weight 114.8 kg (253 lb), SpO2 100 %.Body mass index is 32.48 kg/m.  General Appearance: Casual  Eye Contact:  Fair  Speech:  Normal Rate  Volume:  Normal  Mood:  Anxious  Affect:  Congruent  Thought Process:  Coherent  Orientation:  Full (Time, Place, and Person)  Thought Content:  Logical  Suicidal Thoughts:  No  Homicidal Thoughts:  No  Memory:  Immediate;   Fair  Judgement:  Intact  Insight:  Fair  Psychomotor Activity:  Normal  Concentration:  Concentration: Fair  Recall:  Fair  Fund of Knowledge:  Good  Language:  Good  Akathisia:  Negative  Handed:  Right  AIMS (if indicated):     Assets:  Desire for Improvement Resilience Talents/Skills  ADL's:  Intact  Cognition:  WNL  Sleep:  Number of Hours: 6     Treatment Plan Summary: Daily contact with patient to assess and evaluate symptoms and progress in treatment, Medication management and Plan Patient is seen and examined.  Patient is 42 year old male with the above-stated past psychiatric history seen in follow-up.  I am going to increase his Seroquel up to 400 mg p.o. nightly  tonight, but continue the Depakote at 750 mg.  We will get a Depakote level in the a.m., as well as a CBC and liver function enzymes.  We will plan on discharge tomorrow so [facility]  No other changes in his medications.  Antonieta Pert, MD 12/23/2017, 10:36 AM

## 2017-12-23 NOTE — Plan of Care (Signed)
My has attended groups today.  He was pleasant and cooperative.  He denies suicidal ideation today and will contract for safety on the unit.

## 2017-12-24 DIAGNOSIS — Z653 Problems related to other legal circumstances: Secondary | ICD-10-CM

## 2017-12-24 DIAGNOSIS — F1420 Cocaine dependence, uncomplicated: Secondary | ICD-10-CM

## 2017-12-24 DIAGNOSIS — F1290 Cannabis use, unspecified, uncomplicated: Secondary | ICD-10-CM

## 2017-12-24 DIAGNOSIS — Z590 Homelessness: Secondary | ICD-10-CM

## 2017-12-24 LAB — COMPREHENSIVE METABOLIC PANEL
ALT: 25 U/L (ref 17–63)
AST: 20 U/L (ref 15–41)
Albumin: 4 g/dL (ref 3.5–5.0)
Alkaline Phosphatase: 52 U/L (ref 38–126)
Anion gap: 11 (ref 5–15)
BILIRUBIN TOTAL: 0.5 mg/dL (ref 0.3–1.2)
BUN: 13 mg/dL (ref 6–20)
CO2: 24 mmol/L (ref 22–32)
CREATININE: 1.22 mg/dL (ref 0.61–1.24)
Calcium: 9.5 mg/dL (ref 8.9–10.3)
Chloride: 105 mmol/L (ref 101–111)
GFR calc Af Amer: >60 mL/min (ref >60)
Glucose, Bld: 90 mg/dL (ref 65–99)
POTASSIUM: 4 mmol/L (ref 3.5–5.1)
Sodium: 140 mmol/L (ref 135–145)
TOTAL PROTEIN: 7.7 g/dL (ref 6.5–8.1)

## 2017-12-24 LAB — CBC WITH DIFFERENTIAL/PLATELET
BASOS ABS: 0 10*3/uL (ref 0.0–0.1)
Basophils Relative: 1 %
EOS PCT: 5 %
Eosinophils Absolute: 0.3 10*3/uL (ref 0.0–0.7)
HCT: 42.3 % (ref 39.0–52.0)
Hemoglobin: 14 g/dL (ref 13.0–17.0)
LYMPHS ABS: 1.5 10*3/uL (ref 0.7–4.0)
LYMPHS PCT: 28 %
MCH: 30.4 pg (ref 26.0–34.0)
MCHC: 33.1 g/dL (ref 30.0–36.0)
MCV: 92 fL (ref 78.0–100.0)
MONO ABS: 0.6 10*3/uL (ref 0.1–1.0)
Monocytes Relative: 12 %
Neutro Abs: 2.8 10*3/uL (ref 1.7–7.7)
Neutrophils Relative %: 54 %
Platelets: 313 10*3/uL (ref 150–400)
RBC: 4.6 MIL/uL (ref 4.22–5.81)
RDW: 13 % (ref 11.5–15.5)
WBC: 5.2 10*3/uL (ref 4.0–10.5)

## 2017-12-24 LAB — VALPROIC ACID LEVEL: VALPROIC ACID LVL: 14 ug/mL — AB (ref 50.0–100.0)

## 2017-12-24 MED ORDER — TRAZODONE HCL 100 MG PO TABS: 100.0000 mg | ORAL_TABLET | Freq: Every evening | ORAL | 0 refills | Status: DC | PRN

## 2017-12-24 MED ORDER — LISINOPRIL 10 MG PO TABS: 10.0000 mg | ORAL_TABLET | Freq: Every day | ORAL | 0 refills | Status: DC

## 2017-12-24 MED ORDER — HYDROXYZINE HCL 25 MG PO TABS: 25.0000 mg | ORAL_TABLET | Freq: Four times a day (QID) | ORAL | 0 refills | Status: DC | PRN

## 2017-12-24 MED ORDER — AMLODIPINE BESYLATE 2.5 MG PO TABS: 2.5000 mg | ORAL_TABLET | Freq: Every day | ORAL | 0 refills | Status: DC

## 2017-12-24 MED ORDER — CLONIDINE HCL 0.1 MG PO TABS: 0.1000 mg | ORAL_TABLET | Freq: Every day | ORAL | 0 refills | Status: AC

## 2017-12-24 MED ORDER — QUETIAPINE FUMARATE 400 MG PO TABS: 400.0000 mg | ORAL_TABLET | Freq: Every day | ORAL | 0 refills | Status: DC

## 2017-12-24 MED ORDER — CITALOPRAM HYDROBROMIDE 20 MG PO TABS: 20.0000 mg | ORAL_TABLET | Freq: Every day | ORAL | 0 refills | Status: DC

## 2017-12-24 MED ORDER — DIVALPROEX SODIUM ER 250 MG PO TB24: 750.0000 mg | ORAL_TABLET | Freq: Every day | ORAL | 0 refills | Status: DC

## 2017-12-24 MED ORDER — NICOTINE 21 MG/24HR TD PT24: 21.0000 mg | MEDICATED_PATCH | Freq: Every day | TRANSDERMAL | 0 refills | Status: DC

## 2017-12-24 NOTE — [facility] Group Notes (Signed)
LCSW Group Therapy Note  12/24/2017   9:30--10:30am   Type of Therapy and Topic:  Group Therapy: Anger Cues and Responses  Participation Level:  Minimal   Description of Group:   In this group, patients learned how to recognize the physical, cognitive, emotional, and behavioral responses they have to anger-provoking situations.  They identified a recent time they became angry and how they reacted.  They analyzed how their reaction was possibly beneficial and how it was possibly unhelpful.  The group discussed a variety of healthier coping skills that could help with such a situation in the future.  Deep breathing was practiced briefly.  Therapeutic Goals: 1. Patients will remember their last incident of anger and how they felt emotionally and physically, what their thoughts were at the time, and how they behaved. 2. Patients will identify how their behavior at that time worked for them, as well as how it worked against them. 3. Patients will explore possible new behaviors to use in future anger situations. 4. Patients will learn that anger itself is normal and cannot be eliminated, and that healthier reactions can assist with resolving conflict rather than worsening situations.  Summary of Patient Progress:  The patient was with a doctor preparing for discharge, and was not present for most of group.  While present, he talked about how overwhelming everything is for him right now and how he has to take things one step at a time.  The group helped him to figure out his first step as "remain sober."  Therapeutic Modalities:   Cognitive Behavioral Therapy  Lynnell Chad

## 2017-12-24 NOTE — Progress Notes (Signed)
Pt reports his day has been "ok".  He denies SI/HI/AVH at this time.  He denies any withdrawal symptoms.  He tells Clinical research associate that he is supposed to be discharged tomorrow to [facility].  He is looking forward to being discharged.  He voices no needs or concerns at this time.  Support and encouragement offered.  Discharge plans are in process.  Safety maintained with q15 minute checks.

## 2017-12-24 NOTE — Progress Notes (Signed)
D: Pt A & O X 3. Denies SI, HI, AVH and pain at this time. D/C home as ordered. BP elevated this am at 148/107. After medication administration and recheck: 128/91. Plans to discharge to [facility], friends of Annette Stable. Picked up in lobby by friend. A: D/C instructions reviewed with pt including prescriptions and follow up appointment, compliance encouraged. Medication samples not possible as patient has medicaid. All belongings from locker #14 given to pt at time of departure. Scheduled and PRN medications given with verbal education and effects monitored. Safety checks maintained without incident till time of d/c.  R: Pt receptive to care. Compliant with medications when offered. Denies adverse drug reactions when assessed. Verbalized understanding related to d/c instructions. Signed belonging sheet in agreement with items received from locker. Ambulatory with a steady gait. Appears to be in no physical distress at time of departure.
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
## 2017-12-24 NOTE — [facility] Group Notes (Signed)
[facility] Group Notes:  (Nursing/MHT/Case Management/Adjunct)  Date:  12/24/2017  Time:  9:25 AM  Type of Therapy:  Psychoeducational Skills  Participation Level:  Active  Participation Quality:  Appropriate  Affect:  Appropriate  Cognitive:  Alert  Insight:  Appropriate  Engagement in Group:  Engaged  Modes of Intervention:  Discussion and Education  Summary of Progress/Problems:  Pt attended and participated Orientation/ Psychoeducational group. During this group staff discussed the unit rules and the treatment agreement. Pts then participated in a therapeutic game.      Karren Cobble 12/24/2017, 9:25 AM

## 2017-12-24 NOTE — [facility] Suicide Risk Assessment (Signed)
[facility] Discharge Suicide Risk Assessment   Principal Problem: MDD (major depressive disorder), single episode, severe , no psychosis (HCC) Discharge Diagnoses:  Patient Active Problem List   Diagnosis Date Noted  . MDD (major depressive disorder), single episode, severe , no psychosis (HCC) [F32.2] 12/20/2017  . Essential hypertension, benign [I10] 03/25/2013  . Polysubstance dependence (HCC) [F19.20] 03/24/2013  . Alcohol dependence syndrome (HCC) [F10.20] 03/24/2013  . Substance induced mood disorder (HCC) [F19.94] 03/24/2013  . Suicidal thoughts [R45.851] 03/24/2013    Total Time spent with patient: 30 minutes  Musculoskeletal: Strength & Muscle Tone: within normal limits Gait & Station: normal Patient leans: N/A  Psychiatric Specialty Exam: ROS no headache, no chest pain, no shortness of breath, no vomiting , no fever  Blood pressure (!) 148/107, pulse 92, temperature 98.2 F (36.8 C), temperature source Oral, resp. rate 18, height  (1.88 m), weight 114.8 kg (253 lb), SpO2 100 %.Body mass index is 32.48 kg/m.  Repeat BP 128/91, pulse 62.   General Appearance: Well Groomed  Patent attorney::  Fair  Speech:  Normal Rate409  Volume:  Normal  Mood:  improved   Affect:  Appropriate and reactive   Thought Process:  Linear and Descriptions of Associations: Intact  Orientation:  Full (Time, Place, and Person)  Thought Content:  no hallucinations, no delusions, not internally preoccupied   Suicidal Thoughts:  No denies any suicidal or self injurious ideations,no homicidal ideations, no violent ideations  Homicidal Thoughts:  No  Memory:  recent and remote grossly intact   Judgement:  Other:  improved   Insight:  improved  Psychomotor Activity:  Normal  Concentration:  Good  Recall:  Good  Fund of Knowledge:Good  Language: Good  Akathisia:  Negative  Handed:  Right  AIMS (if indicated):     Assets:  Communication Skills Desire for Improvement Resilience  Sleep:  Number of  Hours: 6  Cognition: WNL  ADL's:  Intact   Mental Status Per Nursing Assessment::   On Admission:     Demographic Factors:  41 year year old single male, has three children , who live in [place] with their mother.   Loss Factors: Recent relapse , daughter passed away 4 years ago.  Historical Factors: Patient reports history of Bipolar Disorder diagnosis, history of alcohol , cocaine abuse.   Risk Reduction Factors:   Positive coping skills or problem solving skills  Continued Clinical Symptoms:  At this time patient is alert, attentive, well related, calm, mood improved , states he is feeling better, affect is appropriate, reactive, no thought disorder, no suicidal or self injurious ideations, no homicidal or violent ideations, no hallucinations, no delusions, not internally preoccupied, future oriented, plans to go to an [facility] setting at discharge .  Behavior on unit in good control, cooperative on approach. Denies medication side effects.    Cognitive Features That Contribute To Risk:  No gross cognitive deficits noted upon discharge. Is alert , attentive, and oriented x 3     Suicide Risk:  Mild:  Suicidal ideation of limited frequency, intensity, duration, and specificity.  There are no identifiable plans, no associated intent, mild dysphoria and related symptoms, good self-control (both objective and subjective assessment), few other risk factors, and identifiable protective factors, including available and accessible social support.  Follow-up Information    [facility] Follow up on 12/29/2017.   Specialty:  Behavioral Health Why:  Hospital follow-up on Thursday, 5/16 at 8:00AM. Please bring photo ID and Medicaid card if you  have it to this appt. Thank you.  Contact information: [address] [place] [address] [phone]           Plan Of Care/Follow-up recommendations:  Activity:  as tolerated  Diet:  heart healthy Tests:  NA Other:  See below  Patient is  expressing readiness for discharge and is leaving unit in good spirits . Patient plans to go to [facility] to follow up as above  Referred to [facility] for medical management as needed   Craige Cotta, MD 12/24/2017, 9:24 AM

## 2018-01-24 ENCOUNTER — Emergency Department (HOSPITAL_COMMUNITY)
Admission: EM | Admit: 2018-01-24 | Discharge: 2018-01-24 | Disposition: A | Discharge status: Home / Self Care | Payer: Medicaid Other | Attending: Emergency Medicine | Admitting: Emergency Medicine

## 2018-01-24 ENCOUNTER — Encounter (HOSPITAL_COMMUNITY): Payer: Self-pay

## 2018-01-24 DIAGNOSIS — H10213 Acute toxic conjunctivitis, bilateral: Secondary | ICD-10-CM

## 2018-01-24 DIAGNOSIS — T6591XA Toxic effect of unspecified substance, accidental (unintentional), initial encounter: Secondary | ICD-10-CM | POA: Diagnosis not present

## 2018-01-24 DIAGNOSIS — F17210 Nicotine dependence, cigarettes, uncomplicated: Secondary | ICD-10-CM | POA: Diagnosis not present

## 2018-01-24 DIAGNOSIS — H5713 Ocular pain, bilateral: Secondary | ICD-10-CM | POA: Diagnosis present

## 2018-01-24 DIAGNOSIS — Z79899 Other long term (current) drug therapy: Secondary | ICD-10-CM | POA: Insufficient documentation

## 2018-01-24 MED ORDER — IBUPROFEN 200 MG PO TABS
600.0000 mg | ORAL_TABLET | Freq: Once | ORAL | Status: AC
  Administered 2018-01-24: 600 mg via ORAL
  Filled 2018-01-24: qty 3

## 2018-01-24 MED ORDER — OXYCODONE-ACETAMINOPHEN 5-325 MG PO TABS
1.0000 | ORAL_TABLET | Freq: Once | ORAL | Status: AC
  Administered 2018-01-24: 1 via ORAL
  Filled 2018-01-24: qty 1

## 2018-01-24 MED ORDER — FLUORESCEIN SODIUM 1 MG OP STRP
1.0000 | ORAL_STRIP | Freq: Once | OPHTHALMIC | Status: AC
  Administered 2018-01-24: 1 via OPHTHALMIC
  Filled 2018-01-24: qty 1

## 2018-01-24 MED ORDER — OFLOXACIN 0.3 % OP SOLN
1.0000 [drp] | Freq: Four times a day (QID) | OPHTHALMIC | Status: DC
  Administered 2018-01-24: 1 [drp] via OPHTHALMIC
  Filled 2018-01-24: qty 5

## 2018-01-24 MED ORDER — TETRACAINE HCL 0.5 % OP SOLN
1.0000 [drp] | Freq: Once | OPHTHALMIC | Status: AC
  Administered 2018-01-24: 1 [drp] via OPHTHALMIC
  Filled 2018-01-24: qty 4

## 2018-01-24 NOTE — ED Provider Notes (Signed)
[facility]-EMERGENCY DEPT Provider Note   CSN: [id number] Arrival date & time: 01/24/18  0452     History   Chief Complaint Chief Complaint  Patient presents with  . Chemical Exposure    HPI Eric Livingston is a 42 y.o. male.  HPI  41ym with b/l eye pain. Chemical exposure to eyes at work yesterday. Says bleach dripped through his safety glasses and into his eyes. He has had persistent eye pain, photophobia, and tearing since then. Vision is blurred. Has rinsed his eyes out several times but symptoms persist. Does not wear corrective lenses.   Past Medical History:  Diagnosis Date  . Alcohol abuse   . Depression   . Hyperlipidemia   . Hypertension   . Polysubstance abuse [facility])     Patient Active Problem List   Diagnosis Date Noted  . MDD (major depressive disorder), single episode, severe , no psychosis (HCC) 12/20/2017  . Essential hypertension, benign 03/25/2013  . Polysubstance dependence (HCC) 03/24/2013  . Alcohol dependence syndrome (HCC) 03/24/2013  . Substance induced mood disorder (HCC) 03/24/2013  . Suicidal thoughts 03/24/2013    Past Surgical History:  Procedure Laterality Date  . HERNIA REPAIR          Home Medications    Prior to Admission medications   Medication Sig Start Date End Date Taking? Authorizing Provider  albuterol (PROVENTIL HFA;VENTOLIN HFA) 108 (90 Base) MCG/ACT inhaler Inhale 2 puffs into the lungs every 6 (six) hours as needed for wheezing or shortness of breath.    [provider]  amLODipine (NORVASC) 2.5 MG tablet Take 1 tablet (2.5 mg total) by mouth daily. 12/25/17   Oneta Rack, NP  benzocaine (ORAJEL) 10 % mucosal gel Use as directed 1 application in the mouth or throat as needed for mouth pain. Patient not taking: Reported on 12/20/2017 05/25/17   Jacalyn Lefevre, MD  citalopram (CELEXA) 20 MG tablet Take 1 tablet (20 mg total) by mouth daily. 12/25/17   Oneta Rack, NP  cloNIDine  (CATAPRES) 0.1 MG tablet Take 1 tablet (0.1 mg total) by mouth at bedtime. 12/24/17   Oneta Rack, NP  divalproex (DEPAKOTE ER) 250 MG 24 hr tablet Take 3 tablets (750 mg total) by mouth at bedtime. 12/24/17   Oneta Rack, NP  hydrOXYzine (ATARAX/VISTARIL) 25 MG tablet Take 1 tablet (25 mg total) by mouth every 6 (six) hours as needed for anxiety. 12/24/17   Oneta Rack, NP  lisinopril (PRINIVIL,ZESTRIL) 10 MG tablet Take 1 tablet (10 mg total) by mouth daily. 12/25/17   Oneta Rack, NP  nicotine (NICODERM CQ - DOSED IN MG/24 HOURS) 21 mg/24hr patch Place 1 patch (21 mg total) onto the skin daily. 12/25/17   Oneta Rack, NP  QUEtiapine (SEROQUEL) 400 MG tablet Take 1 tablet (400 mg total) by mouth at bedtime. 12/24/17   Oneta Rack, NP  traZODone (DESYREL) 100 MG tablet Take 1 tablet (100 mg total) by mouth at bedtime as needed for sleep. 12/24/17   Oneta Rack, NP    Family History History reviewed. No pertinent family history.  Social History Social History   Tobacco Use  . Smoking status: Current Every Day Smoker    Packs/day: 1.00    Years: 15.00    Pack years: 15.00    Types: Cigarettes  . Smokeless tobacco: Current User  Substance Use Topics  . Alcohol use: Yes    Comment: daily  . Drug use:  Yes    Types: Marijuana, Cocaine    Comment: ice, heroin     Allergies   Patient has no known allergies.   Review of Systems Review of Systems  All systems reviewed and negative, other than as noted in HPI.  Physical Exam Updated Vital Signs BP (!) 188/131 (BP Location: Left Arm)   Pulse 97   Temp 97.9 F (36.6 C) (Oral)   Resp 18   SpO2 100%   Physical Exam  Constitutional: He appears well-developed and well-nourished. No distress.  HENT:  Head: Normocephalic and atraumatic.  Eyes: Right eye exhibits no discharge. Left eye exhibits no discharge.  Lights off in room. Sunglasses noted on bedside table. Laying with eyes closed. Some mild upper lid  swelling otherwise periorbital tissues appear normal. Severe blepharospasm limiting exam but could manually retract after tetracaine. Visualized conjunctiva very injected. Tearing. ph 7-8 b/l. No focal uptake of fluorescein. Corneas appear somewhat edematous but clear.     Neck: Neck supple.  Cardiovascular: Normal rate, regular rhythm and normal heart sounds. Exam reveals no gallop and no friction rub.  No murmur heard. Pulmonary/Chest: Effort normal and breath sounds normal. No respiratory distress.  Abdominal: Soft. He exhibits no distension. There is no tenderness.  Musculoskeletal: He exhibits no edema or tenderness.  Neurological: He is alert.  Skin: Skin is warm and dry.  Psychiatric: He has a normal mood and affect. His behavior is normal. Thought content normal.  Nursing note and vitals reviewed.    ED Treatments / Results  Labs (all labs ordered are listed, but only abnormal results are displayed) Labs Reviewed - No data to display  EKG None  Radiology No results found.  Procedures Procedures (including critical care time)  Medications Ordered in ED Medications  tetracaine (PONTOCAINE) 0.5 % ophthalmic solution 1-2 drop (has no administration in time range)  fluorescein ophthalmic strip 1 strip (has no administration in time range)  ibuprofen (ADVIL,MOTRIN) tablet 600 mg (has no administration in time range)  oxyCODONE-acetaminophen (PERCOCET/ROXICET) 5-325 MG per tablet 1 tablet (has no administration in time range)     Initial Impression / Assessment and Plan / ED Course  I have reviewed the triage vital signs and the nursing notes.  Pertinent labs & imaging results that were available during my care of the patient were reviewed by me and considered in my medical decision making (see chart for details).     89-year-old male with chemical conjunctivitis to his eyes.  pH is normal.  Visual acuity is very poor and he can only count fingers.  Discussed with  ophthalmology.  He is to leave the ER and go over to Dr. Marnee SpringMccuen's office.  Final Clinical Impressions(s) / ED Diagnoses   Final diagnoses:  Chemical conjunctivitis of both eyes    ED Discharge Orders    None       Raeford RazorKohut, Aitan Rossbach, MD 01/29/18 1454

## 2018-01-24 NOTE — ED Triage Notes (Signed)
Pt states that he got Clorox in his eyes yesterday afternoon, he has flushed them several times and they continue to burn and he can't open his eyes

## 2018-05-17 ENCOUNTER — Emergency Department (HOSPITAL_COMMUNITY)
Admission: EM | Admit: 2018-05-17 | Discharge: 2018-05-17 | Disposition: A | Discharge status: Home / Self Care | Payer: Medicaid Other | Attending: Emergency Medicine | Admitting: Emergency Medicine

## 2018-05-17 ENCOUNTER — Other Ambulatory Visit: Payer: Self-pay

## 2018-05-17 ENCOUNTER — Encounter (HOSPITAL_COMMUNITY): Payer: Self-pay | Admitting: Emergency Medicine

## 2018-05-17 DIAGNOSIS — I10 Essential (primary) hypertension: Secondary | ICD-10-CM | POA: Insufficient documentation

## 2018-05-17 DIAGNOSIS — F1210 Cannabis abuse, uncomplicated: Secondary | ICD-10-CM | POA: Insufficient documentation

## 2018-05-17 DIAGNOSIS — Z046 Encounter for general psychiatric examination, requested by authority: Secondary | ICD-10-CM | POA: Diagnosis present

## 2018-05-17 DIAGNOSIS — F1410 Cocaine abuse, uncomplicated: Secondary | ICD-10-CM | POA: Insufficient documentation

## 2018-05-17 DIAGNOSIS — F4310 Post-traumatic stress disorder, unspecified: Secondary | ICD-10-CM | POA: Diagnosis not present

## 2018-05-17 DIAGNOSIS — F17228 Nicotine dependence, chewing tobacco, with other nicotine-induced disorders: Secondary | ICD-10-CM | POA: Diagnosis not present

## 2018-05-17 DIAGNOSIS — R45851 Suicidal ideations: Secondary | ICD-10-CM | POA: Diagnosis not present

## 2018-05-17 DIAGNOSIS — F17210 Nicotine dependence, cigarettes, uncomplicated: Secondary | ICD-10-CM | POA: Insufficient documentation

## 2018-05-17 DIAGNOSIS — F322 Major depressive disorder, single episode, severe without psychotic features: Secondary | ICD-10-CM | POA: Diagnosis present

## 2018-05-17 HISTORY — DX: Post-traumatic stress disorder, unspecified: F43.10

## 2018-05-17 LAB — CBC
HCT: 43.1 % (ref 39.0–52.0)
HEMOGLOBIN: 14.8 g/dL (ref 13.0–17.0)
MCH: 30.7 pg (ref 26.0–34.0)
MCHC: 34.3 g/dL (ref 30.0–36.0)
MCV: 89.4 fL (ref 78.0–100.0)
Platelets: 315 10*3/uL (ref 150–400)
RBC: 4.82 MIL/uL (ref 4.22–5.81)
RDW: 13.8 % (ref 11.5–15.5)
WBC: 10.4 10*3/uL (ref 4.0–10.5)

## 2018-05-17 LAB — COMPREHENSIVE METABOLIC PANEL
ALK PHOS: 58 U/L (ref 38–126)
ALT: 29 U/L (ref 0–44)
ANION GAP: 13 (ref 5–15)
AST: 27 U/L (ref 15–41)
Albumin: 4.6 g/dL (ref 3.5–5.0)
BUN: 15 mg/dL (ref 6–20)
CALCIUM: 9.8 mg/dL (ref 8.9–10.3)
CO2: 23 mmol/L (ref 22–32)
Chloride: 106 mmol/L (ref 98–111)
Creatinine, Ser: 1.27 mg/dL — ABNORMAL HIGH (ref 0.61–1.24)
GFR calc non Af Amer: >60 mL/min (ref >60)
GLUCOSE: 89 mg/dL (ref 70–99)
POTASSIUM: 4 mmol/L (ref 3.5–5.1)
Sodium: 142 mmol/L (ref 135–145)
Total Bilirubin: 1 mg/dL (ref 0.3–1.2)
Total Protein: 8.4 g/dL — ABNORMAL HIGH (ref 6.5–8.1)

## 2018-05-17 LAB — SALICYLATE LEVEL

## 2018-05-17 LAB — ACETAMINOPHEN LEVEL

## 2018-05-17 LAB — ETHANOL: Alcohol, Ethyl (B): <10 mg/dL (ref <10)

## 2018-05-17 MED ORDER — LISINOPRIL 10 MG PO TABS: 10.0000 mg | ORAL_TABLET | Freq: Every day | ORAL | Status: DC

## 2018-05-17 NOTE — Discharge Instructions (Signed)
For your mental health needs, you are advised to follow up with one of the following providers at your earliest opportunity:  IN THE [place] AREA:       [facility]      [address]      [place], [place] [address]      [phone]     New and returning patients are seen at their walk-in clinic.  Walk-in hours are Monday - Friday from 8:00 am - 3:00 pm.  Walk-in patients are seen on a first come, first served basis.  Try to arrive as early as possible for he best chance of being seen the same day.  IN THE [place] AREA:       [facility]      [address]      [place], [place] [address]       [phone]

## 2018-05-17 NOTE — ED Provider Notes (Signed)
[facility]-EMERGENCY DEPT Provider Note   CSN: [id number] Arrival date & time: 05/17/18  2440     History   Chief Complaint Chief Complaint  Patient presents with  . Suicidal    HPI Eric Livingston is a 42 y.o. male.  HPI Patient presents to the emergency room for evaluation of depression and suicidal ideation.  Patient states he has a history of mental health problems.  He has PTSD.  Patient also has history of substance abuse.  Patient admits to using cocaine yesterday.  Patient states he started to become more depressed.  He was thinking about jumping in front of traffic today.  He also tried to overdose on cocaine yesterday but was unsuccessful.  Patient was found by [employer] today on the side of the road.  Patient states he has not been taking any of his medications.  He feels like he is going to pop.  Patient wants to get some help. Past Medical History:  Diagnosis Date  . Alcohol abuse   . Depression   . Hyperlipidemia   . Hypertension   . Polysubstance abuse (HCC)   . PTSD (post-traumatic stress disorder)     Patient Active Problem List   Diagnosis Date Noted  . MDD (major depressive disorder), single episode, severe , no psychosis (HCC) 12/20/2017  . Essential hypertension, benign 03/25/2013  . Polysubstance dependence (HCC) 03/24/2013  . Alcohol dependence syndrome (HCC) 03/24/2013  . Substance induced mood disorder (HCC) 03/24/2013  . Suicidal thoughts 03/24/2013    Past Surgical History:  Procedure Laterality Date  . HERNIA REPAIR          Home Medications    Prior to Admission medications   Medication Sig Start Date End Date Taking? Authorizing Provider  albuterol (PROVENTIL HFA;VENTOLIN HFA) 108 (90 Base) MCG/ACT inhaler Inhale 2 puffs into the lungs every 6 (six) hours as needed for wheezing or shortness of breath.    [provider]  amLODipine (NORVASC) 2.5 MG tablet Take 1 tablet (2.5 mg total) by mouth daily. Patient  not taking: Reported on 05/17/2018 12/25/17   Oneta Rack, NP  citalopram (CELEXA) 20 MG tablet Take 1 tablet (20 mg total) by mouth daily. Patient not taking: Reported on 05/17/2018 12/25/17   Oneta Rack, NP  cloNIDine (CATAPRES) 0.1 MG tablet Take 1 tablet (0.1 mg total) by mouth at bedtime. Patient not taking: Reported on 05/17/2018 12/24/17   Oneta Rack, NP  divalproex (DEPAKOTE ER) 250 MG 24 hr tablet Take 3 tablets (750 mg total) by mouth at bedtime. Patient not taking: Reported on 05/17/2018 12/24/17   Oneta Rack, NP  hydrOXYzine (ATARAX/VISTARIL) 25 MG tablet Take 1 tablet (25 mg total) by mouth every 6 (six) hours as needed for anxiety. Patient not taking: Reported on 05/17/2018 12/24/17   Oneta Rack, NP  lisinopril (PRINIVIL,ZESTRIL) 10 MG tablet Take 1 tablet (10 mg total) by mouth daily. Patient not taking: Reported on 05/17/2018 12/25/17   Oneta Rack, NP  nicotine (NICODERM CQ - DOSED IN MG/24 HOURS) 21 mg/24hr patch Place 1 patch (21 mg total) onto the skin daily. Patient not taking: Reported on 05/17/2018 12/25/17   Oneta Rack, NP  QUEtiapine (SEROQUEL) 400 MG tablet Take 1 tablet (400 mg total) by mouth at bedtime. Patient not taking: Reported on 05/17/2018 12/24/17   Oneta Rack, NP  traZODone (DESYREL) 100 MG tablet Take 1 tablet (100 mg total) by mouth at bedtime as needed for  sleep. Patient not taking: Reported on 05/17/2018 12/24/17   Oneta Rack, NP    Family History No family history on file.  Social History Social History   Tobacco Use  . Smoking status: Current Every Day Smoker    Packs/day: 1.00    Years: 15.00    Pack years: 15.00    Types: Cigarettes  . Smokeless tobacco: Current User  Substance Use Topics  . Alcohol use: Yes    Comment: daily  . Drug use: Yes    Types: Marijuana, Cocaine    Comment: ice, heroin     Allergies   Patient has no known allergies.   Review of Systems Review of Systems  All other systems  reviewed and are negative.    Physical Exam Updated Vital Signs BP (!) 136/97 (BP Location: Left Arm)   Pulse 88   Temp 98.4 F (36.9 C) (Oral)   Resp 16   SpO2 99%   Physical Exam  HENT:  Head: Normocephalic and atraumatic.  Right Ear: External ear normal.  Left Ear: External ear normal.  Eyes: Conjunctivae are normal. Right eye exhibits no discharge. Left eye exhibits no discharge. No scleral icterus.  Neck: Neck supple. No tracheal deviation present.  Cardiovascular: Normal rate, regular rhythm and intact distal pulses.  Pulmonary/Chest: Effort normal and breath sounds normal. No stridor. No respiratory distress. He has no wheezes. He has no rales.  Abdominal: Soft. Bowel sounds are normal. He exhibits no distension. There is no tenderness. There is no rebound and no guarding.  Musculoskeletal: He exhibits no edema or tenderness.  Neurological: He is alert. He has normal strength. No cranial nerve deficit (no facial droop, extraocular movements intact, no slurred speech) or sensory deficit. He exhibits normal muscle tone. He displays no seizure activity. Coordination normal.  Skin: Skin is warm and dry. No rash noted. He is not diaphoretic.  Psychiatric: His affect is not angry and not inappropriate. His speech is not rapid and/or pressured, not delayed, not tangential and not slurred. He is withdrawn. He is not aggressive and not hyperactive. He exhibits a depressed mood. He expresses suicidal ideation. He expresses suicidal plans. He is communicative.  Nursing note and vitals reviewed.    ED Treatments / Results  Labs (all labs ordered are listed, but only abnormal results are displayed) Labs Reviewed  COMPREHENSIVE METABOLIC PANEL - Abnormal; Notable for the following components:      Result Value   Creatinine, Ser 1.27 (*)    Total Protein 8.4 (*)    All other components within normal limits  ACETAMINOPHEN LEVEL - Abnormal; Notable for the following components:    Acetaminophen (Tylenol), Serum <10 (*)    All other components within normal limits  ETHANOL  SALICYLATE LEVEL  CBC     Procedures Procedures (including critical care time)  Medications Ordered in ED Medications - No data to display   Initial Impression / Assessment and Plan / ED Course  I have reviewed the triage vital signs and the nursing notes.  Pertinent labs & imaging results that were available during my care of the patient were reviewed by me and considered in my medical decision making (see chart for details).   Labs reviewed.  No significant abnormalities.  Pt medically cleared for psychiatric evaluation. Pt was evaluated by psychiatry and deemed stable for discharge.   Final Clinical Impressions(s) / ED Diagnoses   Final diagnoses:  MDD (major depressive disorder), single episode, severe , no psychosis (HCC)  ED Discharge Orders    None       Linwood Dibbles, MD 05/18/18 1217

## 2018-05-17 NOTE — [facility] Suicide Risk Assessment (Cosign Needed)
Suicide Risk Assessment  Discharge Assessment   [facility] Discharge Suicide Risk Assessment   Principal Problem: MDD (major depressive disorder), single episode, severe , no psychosis (HCC) Discharge Diagnoses:  Patient Active Problem List   Diagnosis Date Noted  . MDD (major depressive disorder), single episode, severe , no psychosis (HCC) [F32.2] 12/20/2017  . Essential hypertension, benign [I10] 03/25/2013  . Polysubstance dependence (HCC) [F19.20] 03/24/2013  . Alcohol dependence syndrome (HCC) [F10.20] 03/24/2013  . Substance induced mood disorder (HCC) [F19.94] 03/24/2013  . Suicidal thoughts [R45.851] 03/24/2013    Total Time spent with patient: 30 minutes  Musculoskeletal: Strength & Muscle Tone: within normal limits Gait & Station: normal Patient leans: N/A  Psychiatric Specialty Exam:   Blood pressure (!) 156/110, pulse (!) 119, temperature 98.1 F (36.7 C), temperature source Oral, resp. rate 16, SpO2 98 %.There is no height or weight on file to calculate BMI.  General Appearance: Casual  Eye Contact::  Fair  Speech:  Clear and Coherent409  Volume:  Normal  Mood:  Angry, Anxious, Depressed and Irritable  Affect:  Congruent and Labile  Thought Process:  Coherent, Goal Directed and Linear  Orientation:  Full (Time, Place, and Person)  Thought Content:  Logical  Suicidal Thoughts:  No  Homicidal Thoughts:  No  Memory:  Immediate;   Good Recent;   Fair Remote;   Fair  Judgement:  Fair  Insight:  Fair  Psychomotor Activity:  Normal  Concentration:  Fair  Recall:  [employer] of Knowledge:Good  Language: Good  Akathisia:  No  Handed:  Right  AIMS (if indicated):     Assets:  Communication Skills Physical Health  Sleep:     Cognition: WNL  ADL's:  Intact   Mental Status Per Nursing Assessment::   On Admission:   Pt was seen and chart reviewed. Pt is irritable and angry and unwilling to talk to the treatment team. He is refusing to provide a urine specimen for a  drug screen. He said " you can read it in my chart." Pt is psychiatrically clear for discharge.   Demographic Factors:  Male, Low socioeconomic status and Unemployed  Loss Factors: Financial problems/change in socioeconomic status  Historical Factors: Impulsivity, Pt is using drugs and alcohol and homeless  Risk Reduction Factors:   Sense of responsibility to family  Continued Clinical Symptoms:  Depression:   Impulsivity Alcohol/Substance Abuse/Dependencies  Cognitive Features That Contribute To Risk:  Closed-mindedness    Suicide Risk:  Minimal: No identifiable suicidal ideation.  Patients presenting with no risk factors but with morbid ruminations; may be classified as minimal risk based on the severity of the depressive symptoms    Plan Of Care/Follow-up recommendations:  Activity:  as tolerated Diet:  heart healthy  Laveda Abbe, NP 05/17/2018, 11:58 AM

## 2018-05-17 NOTE — ED Notes (Signed)
Pt discharged safely with resources.  All belongings were returned to pt.  Pt was in no distress. 
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
## 2018-05-17 NOTE — ED Triage Notes (Signed)
Pt here with [employer] voluntarily. Pt stated he had a plan to step in front of traffic. [employer] picked pt up on side of road. Pt stated he is undergoing lots of life stress, but did not want to go into much detail. Pt stated he has been off his medications for "awhile" and not sleeping much. Pt stated he got into an argument with his gf who is in [facility]. Pt does not contract for safety and states he feels like he is "going to pop and any moment". Pt denies HI and denies AVH

## 2018-05-17 NOTE — BH Assessment (Signed)
[facility] Assessment Progress Note  Per Juanetta Beets, DO, this pt does not require psychiatric hospitalization at this time.  Pt is to be discharged from [facility].  Discharge instructions include referral information for [facility] in the [place] area, and [facility] in the [place] area.  Pt's nurse, Kendal Hymen, has been notified.  Doylene Canning, MA Triage Specialist [phone]

## 2023-07-01 ENCOUNTER — Emergency Department (HOSPITAL_COMMUNITY)
Admission: EM | Admit: 2023-07-01 | Discharge: 2023-07-02 | Disposition: A | Discharge status: Other Acute Inpatient Hospital | Payer: MEDICAID | Source: Home / Self Care | Attending: Emergency Medicine | Admitting: Emergency Medicine

## 2023-07-01 ENCOUNTER — Other Ambulatory Visit: Payer: Self-pay

## 2023-07-01 ENCOUNTER — Emergency Department (HOSPITAL_COMMUNITY): Payer: MEDICAID

## 2023-07-01 DIAGNOSIS — F4310 Post-traumatic stress disorder, unspecified: Secondary | ICD-10-CM | POA: Insufficient documentation

## 2023-07-01 DIAGNOSIS — I10 Essential (primary) hypertension: Secondary | ICD-10-CM | POA: Insufficient documentation

## 2023-07-01 DIAGNOSIS — R600 Localized edema: Secondary | ICD-10-CM | POA: Insufficient documentation

## 2023-07-01 DIAGNOSIS — R1084 Generalized abdominal pain: Secondary | ICD-10-CM | POA: Insufficient documentation

## 2023-07-01 DIAGNOSIS — F1410 Cocaine abuse, uncomplicated: Secondary | ICD-10-CM | POA: Insufficient documentation

## 2023-07-01 DIAGNOSIS — F17210 Nicotine dependence, cigarettes, uncomplicated: Secondary | ICD-10-CM | POA: Insufficient documentation

## 2023-07-01 DIAGNOSIS — R45851 Suicidal ideations: Secondary | ICD-10-CM | POA: Insufficient documentation

## 2023-07-01 DIAGNOSIS — Z79899 Other long term (current) drug therapy: Secondary | ICD-10-CM | POA: Insufficient documentation

## 2023-07-01 DIAGNOSIS — F323 Major depressive disorder, single episode, severe with psychotic features: Secondary | ICD-10-CM | POA: Insufficient documentation

## 2023-07-01 DIAGNOSIS — D649 Anemia, unspecified: Secondary | ICD-10-CM | POA: Insufficient documentation

## 2023-07-01 DIAGNOSIS — Y900 Blood alcohol level of less than 20 mg/100 ml: Secondary | ICD-10-CM | POA: Insufficient documentation

## 2023-07-01 DIAGNOSIS — R609 Edema, unspecified: Secondary | ICD-10-CM

## 2023-07-01 LAB — CBC
HCT: 41.1 % (ref 39.0–52.0)
Hemoglobin: 12.8 g/dL — ABNORMAL LOW (ref 13.0–17.0)
MCH: 27.3 pg (ref 26.0–34.0)
MCHC: 31.1 g/dL (ref 30.0–36.0)
MCV: 87.6 fL (ref 80.0–100.0)
Platelets: 286 10*3/uL (ref 150–400)
RBC: 4.69 MIL/uL (ref 4.22–5.81)
RDW: 14.7 % (ref 11.5–15.5)
WBC: 6.6 10*3/uL (ref 4.0–10.5)
nRBC: 0 % (ref 0.0–0.2)

## 2023-07-01 LAB — ETHANOL: Alcohol, Ethyl (B): <10 mg/dL (ref <10)

## 2023-07-01 LAB — COMPREHENSIVE METABOLIC PANEL
ALT: 31 U/L (ref 0–44)
AST: 39 U/L (ref 15–41)
Albumin: 3.9 g/dL (ref 3.5–5.0)
Alkaline Phosphatase: 61 U/L (ref 38–126)
Anion gap: 10 (ref 5–15)
BUN: 12 mg/dL (ref 6–20)
CO2: 24 mmol/L (ref 22–32)
Calcium: 9.1 mg/dL (ref 8.9–10.3)
Chloride: 108 mmol/L (ref 98–111)
Creatinine, Ser: 1.12 mg/dL (ref 0.61–1.24)
GFR, Estimated: >60 mL/min (ref >60)
Glucose, Bld: 90 mg/dL (ref 70–99)
Potassium: 4.4 mmol/L (ref 3.5–5.1)
Sodium: 142 mmol/L (ref 135–145)
Total Bilirubin: 0.3 mg/dL (ref <1.2)
Total Protein: 7.4 g/dL (ref 6.5–8.1)

## 2023-07-01 LAB — SALICYLATE LEVEL: Salicylate Lvl: <7 mg/dL — ABNORMAL LOW (ref 7.0–30.0)

## 2023-07-01 LAB — TROPONIN I (HIGH SENSITIVITY): Troponin I (High Sensitivity): 14 ng/L (ref <18)

## 2023-07-01 LAB — LIPASE, BLOOD: Lipase: 30 U/L (ref 11–51)

## 2023-07-01 LAB — BRAIN NATRIURETIC PEPTIDE: B Natriuretic Peptide: 26.8 pg/mL (ref 0.0–100.0)

## 2023-07-01 LAB — ACETAMINOPHEN LEVEL: Acetaminophen (Tylenol), Serum: <10 ug/mL — ABNORMAL LOW (ref 10–30)

## 2023-07-01 MED ORDER — ONDANSETRON HCL 4 MG/2ML IJ SOLN
4.0000 mg | Freq: Once | INTRAMUSCULAR | Status: AC
  Administered 2023-07-01: 4 mg via INTRAVENOUS
  Filled 2023-07-01: qty 2

## 2023-07-01 MED ORDER — AMLODIPINE BESYLATE 5 MG PO TABS
5.0000 mg | ORAL_TABLET | Freq: Every day | ORAL | Status: DC
  Administered 2023-07-01 – 2023-07-02 (×2): 5 mg via ORAL
  Filled 2023-07-01 (×2): qty 1

## 2023-07-01 MED ORDER — METOPROLOL SUCCINATE ER 25 MG PO TB24
50.0000 mg | ORAL_TABLET | Freq: Every day | ORAL | Status: DC
  Administered 2023-07-01 – 2023-07-02 (×2): 50 mg via ORAL
  Filled 2023-07-01 (×2): qty 2

## 2023-07-01 NOTE — BH Assessment (Signed)
 TTS consult has been deferred to IRIS. IRIS coordinator will reach out in established secure chat with provider name and assessment time.

## 2023-07-01 NOTE — ED Notes (Signed)
Pt has 2 belonging bags and 1 umbrella locked in locker 4 in purple zone. Also has valuables with security. Wanded by security at bedside this time.

## 2023-07-01 NOTE — ED Provider Notes (Signed)
[place] EMERGENCY DEPARTMENT AT [facility] Provider Note   CSN: [id number] Arrival date & time: 07/01/23  8657     History  Chief Complaint  Patient presents with   Insomnia   Anxiety   Suicidal   Nausea   Emesis   Abdominal Pain    Eric Livingston is a 47 y.o. male.  Patient with medical and psychiatric history of polysubstance use (alcohol, cocaine, methamphetamine), substance induced mood disorder, PTSD, and multiple psychiatric hospitalizations in 2024, uncontrolled hypertension, fluid overload on Lasix --presents to the emergency department today for multiple complaints.  Patient reports approximately 3 weeks of weight gain, abdominal distention, decreased urination, constipation, lower extremity edema.  He reports an episode of vomiting this morning.  He has generalized abdominal discomfort.  He states that he has been taking his blood pressure medications, but they continue to be high.  No fevers.  No chest pain.  His breathing has been "off" per his description.  He has not been sleeping normally.  Also reports waxing and waning suicidal ideations.  Patient has had multiple emergency department visits in [place] in 2024 most recently in the [place], [place], [place] systems.  ECHO 02/03/23 Summary 1. The left ventricular systolic function is normal, LVEF is visually estimated at 65%. 2. The right ventricle is normal in size, with normal systolic function. 3. IVC size and inspiratory change suggest mildly elevated right atrial pressure. (5-10 mmHg).         Home Medications Prior to Admission medications   Medication Sig Start Date End Date Taking? Authorizing Provider  albuterol (PROVENTIL HFA;VENTOLIN HFA) 108 (90 Base) MCG/ACT inhaler Inhale 2 puffs into the lungs every 6 (six) hours as needed for wheezing or shortness of breath.    [provider]  amLODipine (NORVASC) 2.5 MG tablet Take 1 tablet (2.5 mg total) by mouth daily. Patient  not taking: Reported on 05/17/2018 12/25/17   Oneta Rack, NP  citalopram (CELEXA) 20 MG tablet Take 1 tablet (20 mg total) by mouth daily. Patient not taking: Reported on 05/17/2018 12/25/17   Oneta Rack, NP  cloNIDine (CATAPRES) 0.1 MG tablet Take 1 tablet (0.1 mg total) by mouth at bedtime. Patient not taking: Reported on 05/17/2018 12/24/17   Oneta Rack, NP  divalproex (DEPAKOTE ER) 250 MG 24 hr tablet Take 3 tablets (750 mg total) by mouth at bedtime. Patient not taking: Reported on 05/17/2018 12/24/17   Oneta Rack, NP  hydrOXYzine (ATARAX/VISTARIL) 25 MG tablet Take 1 tablet (25 mg total) by mouth every 6 (six) hours as needed for anxiety. Patient not taking: Reported on 05/17/2018 12/24/17   Oneta Rack, NP  lisinopril (PRINIVIL,ZESTRIL) 10 MG tablet Take 1 tablet (10 mg total) by mouth daily. Patient not taking: Reported on 05/17/2018 12/25/17   Oneta Rack, NP  nicotine (NICODERM CQ - DOSED IN MG/24 HOURS) 21 mg/24hr patch Place 1 patch (21 mg total) onto the skin daily. Patient not taking: Reported on 05/17/2018 12/25/17   Oneta Rack, NP  QUEtiapine (SEROQUEL) 400 MG tablet Take 1 tablet (400 mg total) by mouth at bedtime. Patient not taking: Reported on 05/17/2018 12/24/17   Oneta Rack, NP  traZODone (DESYREL) 100 MG tablet Take 1 tablet (100 mg total) by mouth at bedtime as needed for sleep. Patient not taking: Reported on 05/17/2018 12/24/17   Oneta Rack, NP      Allergies    Patient has no known allergies.  Review of Systems   Review of Systems  Physical Exam Updated Vital Signs BP (!) 169/113 (BP Location: Right Arm)   Pulse 87   Temp 98.5 F (36.9 C) (Oral)   Resp 17   Ht 6\' 3"  (1.905 m)   Wt (!) 138.3 kg   SpO2 100%   BMI 38.12 kg/m  Physical Exam Vitals and nursing note reviewed.  Constitutional:      Appearance: He is well-developed. He is not diaphoretic.  HENT:     Head: Normocephalic and atraumatic.     Nose: Nose normal.      Mouth/Throat:     Mouth: Mucous membranes are moist. Mucous membranes are not dry.  Eyes:     Conjunctiva/sclera: Conjunctivae normal.  Neck:     Vascular: Normal carotid pulses. No carotid bruit or JVD.     Trachea: Trachea normal. No tracheal deviation.  Cardiovascular:     Rate and Rhythm: Normal rate and regular rhythm.     Pulses: No decreased pulses.          Radial pulses are 2+ on the right side and 2+ on the left side.     Heart sounds: Normal heart sounds, S1 normal and S2 normal. Heart sounds not distant. No murmur heard. Pulmonary:     Effort: Pulmonary effort is normal. No respiratory distress.     Breath sounds: Normal breath sounds. No wheezing.  Chest:     Chest wall: No tenderness.  Abdominal:     General: Bowel sounds are normal.     Palpations: Abdomen is soft.     Tenderness: There is generalized abdominal tenderness (Mild). There is no guarding or rebound. Negative signs include Murphy's sign and McBurney's sign.  Musculoskeletal:     Cervical back: Normal range of motion and neck supple. No muscular tenderness.     Right lower leg: Edema present.     Left lower leg: Edema present.     Comments: 1+ pitting edema to the bilateral ankles  Skin:    General: Skin is warm and dry.     Coloration: Skin is not pale.  Neurological:     Mental Status: He is alert. Mental status is at baseline.  Psychiatric:        Mood and Affect: Mood normal.        Speech: Speech normal.        Behavior: Behavior is cooperative.        Thought Content: Thought content includes suicidal ideation. Thought content does not include homicidal ideation. Thought content does not include homicidal or suicidal plan.     ED Results / Procedures / Treatments   Labs (all labs ordered are listed, but only abnormal results are displayed) Labs Reviewed  SALICYLATE LEVEL - Abnormal; Notable for the following components:      Result Value   Salicylate Lvl <7.0 (*)    All other components  within normal limits  ACETAMINOPHEN LEVEL - Abnormal; Notable for the following components:   Acetaminophen (Tylenol), Serum <10 (*)    All other components within normal limits  CBC - Abnormal; Notable for the following components:   Hemoglobin 12.8 (*)    All other components within normal limits  COMPREHENSIVE METABOLIC PANEL  ETHANOL  LIPASE, BLOOD  BRAIN NATRIURETIC PEPTIDE  RAPID URINE DRUG SCREEN, HOSP PERFORMED  TROPONIN I (HIGH SENSITIVITY)    Radiology DG Chest Portable 1 View  Result Date: 07/01/2023 CLINICAL DATA:  sob, swelling, CHF EXAM: PORTABLE  CHEST 1 VIEW COMPARISON:  Chest radiograph dated February 20, 2019. FINDINGS: The heart size and mediastinal contours are within normal limits. Both lungs are clear. The visualized skeletal structures are unremarkable. IMPRESSION: No active disease. Electronically Signed   By: Hart Robinsons M.D.   On: 07/01/2023 08:51    Procedures Procedures    Medications Ordered in ED Medications  ondansetron (ZOFRAN) injection 4 mg (4 mg Intravenous Given 07/01/23 0804)    ED Course/ Medical Decision Making/ A&P    Patient seen and examined. History obtained directly from patient.  I reviewed several recent out of system emergency department notes including psych hospitalization.  Labs/EKG: Ordered CBC, CMP, lipase, salicylate and acetaminophen level, ethanol, UDS, BMP and troponin.  Imaging: Ordered chest x-ray  Medications/Fluids: None ordered  Initial impression: Abdominal pain in patient with history of fluid overload, SI  12:30 PM Reassessment performed. Patient appears stable.  Reviewed labs, patient is medically cleared.  Labs personally reviewed and interpreted including: Troponin normal at 14; BMP normal at 26; CMP unremarkable; alcohol negative; salicylate negative; acetaminophen negative; CBC showing mild anemia with hemoglobin 12.8 otherwise unremarkable; lipase normal.  Imaging personally visualized and interpreted  including: Chest x-ray, agree negative.  Most current vital signs reviewed and are as follows: BP (!) 169/113 (BP Location: Right Arm)   Pulse 87   Temp 98.5 F (36.9 C) (Oral)   Resp 17   Ht 6\' 3"  (1.905 m)   Wt (!) 138.3 kg   SpO2 100%   BMI 38.12 kg/m   Plan: No acute medical concerns such as decompensated CHF, no concerns for intra-abdominal infection or obstruction, or other concerning cause of the patient's abdominal pain and nausea.  TTS consultation ordered.  3:28 PM Pt continuing to await TTS consult.                                  Medical Decision Making Amount and/or Complexity of Data Reviewed Labs: ordered. Radiology: ordered.  Risk Prescription drug management.   For this patient's complaint of abdominal pain, the following conditions were considered on the differential diagnosis: gastritis/PUD, enteritis/duodenitis, appendicitis, cholelithiasis/cholecystitis, cholangitis, pancreatitis, ruptured viscus, colitis, diverticulitis, small/large bowel obstruction, proctitis, cystitis, pyelonephritis, ureteral colic, aortic dissection, aortic aneurysm. Atypical chest etiologies were also considered including ACS, PE, and pneumonia.  Lab workup is reassuring.  Do not feel that patient requires CT imaging at this time.  Continuing to await TTS recommendations.        Final Clinical Impression(s) / ED Diagnoses Final diagnoses:  Suicidal ideation  Generalized abdominal pain  Edema, unspecified type    Rx / DC Orders ED Discharge Orders     None         Renne Crigler, PA-C 07/01/23 1529    Margarita Grizzle, MD 07/06/23 [phone]

## 2023-07-01 NOTE — ED Notes (Signed)
Pt is here for SI to overdose on his meds. Pt states he started thinking about taking all of his meds last night. Stopped taking his home meds 2 months ago including his CHF medications. Reports worsening nightmares for weeks now. Pt states he is now more depressed as he lost his brother and daughter recently. His brother killed himself last year per pt. Alert and oriented x 4. Also complains of abdominal pain, bloating and abdominal swelling. Will continue to monitor. Safety precautions maintained.

## 2023-07-01 NOTE — ED Notes (Signed)
Pt changing into scrubs att. Monitor cords removed from room and placed at nursing sta.

## 2023-07-01 NOTE — ED Triage Notes (Addendum)
Pt. Stated, you know I lost my daughter and my brother killed everyone in the house. Eric Livingston been off my meds some. I can't sleep, having nightmares and having suicidal thoughts. Ive had some issues last night and vomited a couple times.Ive not had a bowel movement in 3-4 days and really bloated. Im just really under a lot of stress especially during the holiday.

## 2023-07-02 ENCOUNTER — Other Ambulatory Visit: Payer: Self-pay

## 2023-07-02 ENCOUNTER — Encounter (HOSPITAL_COMMUNITY): Payer: Self-pay | Admitting: Psychiatry

## 2023-07-02 ENCOUNTER — Inpatient Hospital Stay (HOSPITAL_COMMUNITY)
Admission: AD | Admit: 2023-07-02 | Discharge: 2023-07-08 | DRG: 882 | Disposition: A | Discharge status: Home / Self Care | Payer: MEDICAID | Source: Intra-hospital | Attending: Psychiatry | Admitting: Psychiatry

## 2023-07-02 DIAGNOSIS — F333 Major depressive disorder, recurrent, severe with psychotic symptoms: Secondary | ICD-10-CM | POA: Diagnosis present

## 2023-07-02 DIAGNOSIS — F1420 Cocaine dependence, uncomplicated: Secondary | ICD-10-CM | POA: Diagnosis present

## 2023-07-02 DIAGNOSIS — Z5982 Transportation insecurity: Secondary | ICD-10-CM

## 2023-07-02 DIAGNOSIS — Z5941 Food insecurity: Secondary | ICD-10-CM

## 2023-07-02 DIAGNOSIS — Z5900 Homelessness unspecified: Secondary | ICD-10-CM

## 2023-07-02 DIAGNOSIS — R45851 Suicidal ideations: Secondary | ICD-10-CM

## 2023-07-02 DIAGNOSIS — F4312 Post-traumatic stress disorder, chronic: Principal | ICD-10-CM | POA: Diagnosis present

## 2023-07-02 DIAGNOSIS — E785 Hyperlipidemia, unspecified: Secondary | ICD-10-CM | POA: Diagnosis present

## 2023-07-02 DIAGNOSIS — Z5986 Financial insecurity: Secondary | ICD-10-CM

## 2023-07-02 DIAGNOSIS — Z79899 Other long term (current) drug therapy: Secondary | ICD-10-CM

## 2023-07-02 DIAGNOSIS — F1920 Other psychoactive substance dependence, uncomplicated: Secondary | ICD-10-CM | POA: Diagnosis present

## 2023-07-02 DIAGNOSIS — G4700 Insomnia, unspecified: Secondary | ICD-10-CM | POA: Diagnosis present

## 2023-07-02 DIAGNOSIS — Z818 Family history of other mental and behavioral disorders: Secondary | ICD-10-CM

## 2023-07-02 DIAGNOSIS — F419 Anxiety disorder, unspecified: Secondary | ICD-10-CM | POA: Diagnosis present

## 2023-07-02 DIAGNOSIS — F4310 Post-traumatic stress disorder, unspecified: Secondary | ICD-10-CM | POA: Diagnosis not present

## 2023-07-02 DIAGNOSIS — F17210 Nicotine dependence, cigarettes, uncomplicated: Secondary | ICD-10-CM | POA: Diagnosis present

## 2023-07-02 DIAGNOSIS — F332 Major depressive disorder, recurrent severe without psychotic features: Secondary | ICD-10-CM | POA: Diagnosis present

## 2023-07-02 DIAGNOSIS — F1210 Cannabis abuse, uncomplicated: Secondary | ICD-10-CM | POA: Diagnosis present

## 2023-07-02 DIAGNOSIS — I10 Essential (primary) hypertension: Secondary | ICD-10-CM | POA: Diagnosis present

## 2023-07-02 DIAGNOSIS — Z560 Unemployment, unspecified: Secondary | ICD-10-CM | POA: Diagnosis not present

## 2023-07-02 LAB — RAPID URINE DRUG SCREEN, HOSP PERFORMED
Amphetamines: NOT DETECTED
Barbiturates: NOT DETECTED
Benzodiazepines: NOT DETECTED
Cocaine: POSITIVE — AB
Opiates: NOT DETECTED
Tetrahydrocannabinol: NOT DETECTED

## 2023-07-02 MED ORDER — PRAZOSIN HCL 1 MG PO CAPS
1.0000 mg | ORAL_CAPSULE | Freq: Every day | ORAL | Status: DC
  Administered 2023-07-02: 1 mg via ORAL
  Filled 2023-07-02 (×2): qty 1

## 2023-07-02 MED ORDER — METOPROLOL SUCCINATE ER 50 MG PO TB24
50.0000 mg | ORAL_TABLET | Freq: Every day | ORAL | Status: DC
  Administered 2023-07-03 – 2023-07-08 (×6): 50 mg via ORAL
  Filled 2023-07-02 (×9): qty 1

## 2023-07-02 MED ORDER — DIPHENHYDRAMINE HCL 25 MG PO CAPS: 50.0000 mg | ORAL_CAPSULE | Freq: Three times a day (TID) | ORAL | Status: DC | PRN

## 2023-07-02 MED ORDER — SERTRALINE HCL 50 MG PO TABS
50.0000 mg | ORAL_TABLET | Freq: Every day | ORAL | Status: DC
  Administered 2023-07-02: 50 mg via ORAL
  Filled 2023-07-02: qty 1

## 2023-07-02 MED ORDER — HALOPERIDOL 5 MG PO TABS: 5.0000 mg | ORAL_TABLET | Freq: Three times a day (TID) | ORAL | Status: DC | PRN

## 2023-07-02 MED ORDER — MAGNESIUM HYDROXIDE 400 MG/5ML PO SUSP: 30.0000 mL | Freq: Every day | ORAL | Status: DC | PRN

## 2023-07-02 MED ORDER — PRAZOSIN HCL 1 MG PO CAPS
1.0000 mg | ORAL_CAPSULE | Freq: Every day | ORAL | Status: DC
  Administered 2023-07-02 – 2023-07-07 (×6): 1 mg via ORAL
  Filled 2023-07-02 (×9): qty 1

## 2023-07-02 MED ORDER — NICOTINE POLACRILEX 2 MG MT GUM: 2.0000 mg | CHEWING_GUM | OROMUCOSAL | Status: DC | PRN

## 2023-07-02 MED ORDER — HYDROXYZINE HCL 50 MG PO TABS
50.0000 mg | ORAL_TABLET | Freq: Four times a day (QID) | ORAL | Status: DC | PRN
  Administered 2023-07-03 (×2): 50 mg via ORAL
  Filled 2023-07-02 (×2): qty 1

## 2023-07-02 MED ORDER — SERTRALINE HCL 50 MG PO TABS
50.0000 mg | ORAL_TABLET | Freq: Every day | ORAL | Status: DC
  Administered 2023-07-03 – 2023-07-08 (×6): 50 mg via ORAL
  Filled 2023-07-02 (×8): qty 1

## 2023-07-02 MED ORDER — ACETAMINOPHEN 325 MG PO TABS
650.0000 mg | ORAL_TABLET | Freq: Four times a day (QID) | ORAL | Status: DC | PRN
  Administered 2023-07-03: 650 mg via ORAL
  Filled 2023-07-02: qty 2

## 2023-07-02 MED ORDER — TRAZODONE HCL 50 MG PO TABS
50.0000 mg | ORAL_TABLET | Freq: Every evening | ORAL | Status: DC | PRN
Start: 2023-07-02 — End: 2023-07-03

## 2023-07-02 MED ORDER — ALUM & MAG HYDROXIDE-SIMETH 200-200-20 MG/5ML PO SUSP: 30.0000 mL | ORAL | Status: DC | PRN

## 2023-07-02 MED ORDER — AMLODIPINE BESYLATE 5 MG PO TABS
5.0000 mg | ORAL_TABLET | Freq: Every day | ORAL | Status: DC
  Administered 2023-07-03 – 2023-07-08 (×6): 5 mg via ORAL
  Filled 2023-07-02 (×9): qty 1

## 2023-07-02 MED ORDER — TRAZODONE HCL 50 MG PO TABS
50.0000 mg | ORAL_TABLET | Freq: Every evening | ORAL | Status: DC | PRN
  Administered 2023-07-02: 50 mg via ORAL
  Filled 2023-07-02: qty 1

## 2023-07-02 NOTE — ED Notes (Signed)
TTS in process 

## 2023-07-02 NOTE — [facility] Group Notes (Signed)
[facility] Group Notes:  (Nursing/MHT/Case Management/Adjunct)  Date:  07/02/2023  Time:  9:08 PM  Type of Therapy:  Psychoeducational Skills  Participation Level:  Minimal  Participation Quality:  Resistant  Affect:  Depressed and Flat  Cognitive:  Appropriate  Insight:  Appropriate  Engagement in Group:  Limited and Resistant  Modes of Intervention:  Education  Summary of Progress/Problems: Patient initially was hesitant to share, but eventually he stated that he is dealing with the loss of his daughter.   Hazle Coca S 07/02/2023, 9:08 PM

## 2023-07-02 NOTE — ED Notes (Signed)
Report was given to Lurena Joiner, RN at 13:06 from [facility]

## 2023-07-02 NOTE — Progress Notes (Signed)
[facility] LCSW Progress Note   07/02/2023    1:04 PM  Eric Livingston   [id number]   Type of Contact and Topic:  Psychiatric Bed Placement   Pt accepted to [facility] 400-1     Patient meets inpatient criteria per Arsenio Loader, NP   The attending provider will be Dr. Abbott Pao   Call report to [phone]    Lovie Macadamia, RN @ [facility] ED notified.     Pt scheduled  to arrive at [facility] TODAY.    Damita Dunnings, MSW, LCSW-A  1:06 PM 07/02/2023

## 2023-07-02 NOTE — Progress Notes (Signed)
   07/02/23 2110  Psych Admission Type (Psych Patients Only)  Admission Status Voluntary  Psychosocial Assessment  Patient Complaints Anxiety;Depression  Eye Contact Fair  Facial Expression Anxious  Affect Depressed  Speech Logical/coherent  Interaction Assertive  Motor Activity Fidgety  Appearance/Hygiene Unremarkable  Behavior Characteristics Cooperative  Mood Depressed  Thought Process  Coherency WDL  Content WDL  Delusions None reported or observed  Perception WDL  Hallucination None reported or observed  Judgment Impaired  Confusion WDL  Danger to Self  Current suicidal ideation? Denies  Danger to Others  Danger to Others None reported or observed

## 2023-07-02 NOTE — ED Notes (Signed)
Patient left with Safe transport at 1514 to [facility]

## 2023-07-02 NOTE — Consult Note (Signed)
[facility] Consult Note  Patient Name: Eric Livingston MRN: [id number] DOB: 21-Jun-1976 DATE OF Consult: 07/02/2023  PRIMARY PSYCHIATRIC DIAGNOSES  1.  Suicidal ideations 2.  MDD with psychosis 3.  PTSD  RECOMMENDATIONS  Admit to inpatient mental treatment for safety and stabilization.  Medication recommendations: Start Prazosin 1mg  PO QHS for nightmares associated with PTSD. Start Sertraline 50mg  PO daily for depression and anxiety. The potential risks, side effects (including black box warning) benefits and alternative were discussed with the patient, and he verbalized understanding.  Non-Medication/therapeutic recommendations: Refer to outpatient psychiatric provider for medication management and therapy upon discharge from inpatient psych admission.    Communication: Treatment team members (and family members if applicable) who were involved in treatment/care discussions and planning, and with whom we spoke or engaged with via secure text/chat, include the following:  treatment team    Thank you for involving [place] in the care of this patient. If you have any additional questions or concerns, please call [phone] and ask for me or the provider on-call.  TELEPSYCHIATRY ATTESTATION & CONSENT  As the provider for this telehealth consult, I attest that I verified the patient's identity using two separate identifiers, introduced myself to the patient, provided my credentials, disclosed my location, and performed this encounter via a HIPAA-compliant, real-time, face-to-face, two-way, interactive audio and video platform and with the full consent and agreement of the patient (or guardian as applicable.)  Patient physical location: [facility]. Telehealth provider physical location: home office in state of [place].  Video start time: 0134 (Central Time) Video end time: 0144 (Central Time)  IDENTIFYING DATA  Eric Livingston is a 47 y.o. year-old male for whom a psychiatric  consultation has been ordered by the primary provider. The patient was identified using two separate identifiers.  CHIEF COMPLAINT/REASON FOR CONSULT  - I've been having nightmares for the last 3 weeks. - The medication Abilify doesn't work for my PTSD. - I'm having suicidal thoughts. - I can't sleep at night because I'm not on the proper medication. - My anxiety and moods are not managed right now.  HISTORY OF PRESENT ILLNESS (HPI)  The patient, a 47 year old man, presented with ongoing nightmares and symptoms of PTSD, which have persisted despite being prescribed Abilify. He reported that the medication has not been effective in alleviating his nightmares or his PTSD symptoms. The patient has been experiencing these nightmares for the past three weeks and noted that his anxiety and mood have not improved due to the ineffectiveness of the medication. He also mentioned recurrent suicidal thoughts with plan to overdose and significant sleep disturbances.   The PTSD symptoms are linked to the traumatic loss of his one-year-old daughter five years ago and the recent suicide of his brother last year. These events have been highly distressing, and he continues to grieve. The patient expressed feelings of instability and a need to resume effective medication.   Currently, he is not under the care of an outpatient psychiatrist or therapist. He could not recall the dosage of Abilify he was taking and mentioned that he is not on any other psychotropic medications. His last admission to a mental health hospital was a few months ago. He has a history of suicide attempts, including cutting his wrists and arms, attempting to jump off a bridge, and overdosing on pills.   The patient lives in a halfway house and receives full disability benefits. He reported feeling depressed, hopeless, and worthless. He experiences auditory hallucinations, specifically hearing a baby cry  at night, which he attributes to his PTSD and  lack of medication. He denied visual hallucinations. His substance use includes occasional cigarette smoking and alcohol consumption, but he denied using marijuana.   The patient has not taken his prescribed Seroquel for several months and reported that none of the medications he has tried, including Celexa, have been effective. His sister has observed him talking in his sleep, walking around at night, and has urged him to seek help. The patient expressed a strong desire to get back on effective medication and stabilize his condition.  PAST PSYCHIATRIC HISTORY  - Diagnosed with PTSD, depression and anxiety. - Currently prescribed Abilify, but patient unsure of the dose. - Previously prescribed Seroquel and Celexa, but reports they are not effective has stopped taking them. - History of suicide attempts by cutting wrists and arms, attempting to jump off a bridge, and taking a lot of pills. - No current outpatient psychiatrist or therapist. - Last admitted to a mental health hospital a couple of months ago.  PAST MEDICAL HISTORY  Past Medical History:  Diagnosis Date   Alcohol abuse    Depression    Hyperlipidemia    Hypertension    Polysubstance abuse (HCC)    PTSD (post-traumatic stress disorder)      HOME MEDICATIONS  Facility Ordered Medications  Medication   [COMPLETED] ondansetron (ZOFRAN) injection 4 mg   metoprolol succinate (TOPROL-XL) 24 hr tablet 50 mg   amLODipine (NORVASC) tablet 5 mg   PTA Medications  Medication Sig   albuterol (PROVENTIL HFA;VENTOLIN HFA) 108 (90 Base) MCG/ACT inhaler Inhale 2 puffs into the lungs every 6 (six) hours as needed for wheezing or shortness of breath. (Patient not taking: Reported on 07/01/2023)   divalproex (DEPAKOTE ER) 250 MG 24 hr tablet Take 3 tablets (750 mg total) by mouth at bedtime. (Patient not taking: Reported on 05/17/2018)   QUEtiapine (SEROQUEL) 400 MG tablet Take 1 tablet (400 mg total) by mouth at bedtime. (Patient not taking:  Reported on 05/17/2018)   citalopram (CELEXA) 20 MG tablet Take 1 tablet (20 mg total) by mouth daily. (Patient not taking: Reported on 05/17/2018)   cloNIDine (CATAPRES) 0.1 MG tablet Take 1 tablet (0.1 mg total) by mouth at bedtime. (Patient not taking: Reported on 05/17/2018)   cyanocobalamin (VITAMIN B12) 1000 MCG tablet Take 1 tablet by mouth daily. (Patient not taking: Reported on 07/01/2023)   Vitamin D, Ergocalciferol, (DRISDOL) 1.25 MG (50000 UNIT) CAPS capsule Take 50,000 Units by mouth once a week. (Patient not taking: Reported on 07/01/2023)   escitalopram (LEXAPRO) 20 MG tablet Take 20 mg by mouth daily. (Patient not taking: Reported on 07/01/2023)   gabapentin (NEURONTIN) 300 MG capsule Take 300 mg by mouth 3 (three) times daily. (Patient not taking: Reported on 07/01/2023)   hydrOXYzine (VISTARIL) 100 MG capsule Take 100 mg by mouth at bedtime. (Patient not taking: Reported on 07/01/2023)   metoprolol succinate (TOPROL-XL) 50 MG 24 hr tablet Take 50 mg by mouth daily. (Patient not taking: Reported on 07/01/2023)   prazosin (MINIPRESS) 1 MG capsule Take 1 mg by mouth 3 (three) times daily. (Patient not taking: Reported on 07/01/2023)     ALLERGIES  No Known Allergies  SOCIAL & SUBSTANCE USE HISTORY  Social History   Socioeconomic History   Marital status: Single    Spouse name: Not on file   Number of children: Not on file   Years of education: Not on file   Highest education level: Not on  file  Occupational History   Not on file  Tobacco Use   Smoking status: Every Day    Current packs/day: 1.00    Average packs/day: 1 pack/day for 15.0 years (15.0 ttl pk-yrs)    Types: Cigarettes   Smokeless tobacco: Current  Substance and Sexual Activity   Alcohol use: Yes    Comment: daily   Drug use: Yes    Types: Marijuana, Cocaine    Comment: ice, heroin   Sexual activity: Yes    Birth control/protection: None  Other Topics Concern   Not on file  Social History Narrative    Not on file   Social Determinants of Health   Financial Resource Strain: High Risk (03/02/2023)   Received from [facility]   Overall Financial Resource Strain (CARDIA)    Difficulty of Paying Living Expenses: Hard  Food Insecurity: High Risk (05/05/2023)   Received from [facility]   Food Insecurity    Within the past 12 months, did the food you bought just not last and you didn't have money to get more?: Yes    Within the past 12 months, did you worry that your food would run out before you got money to buy more?: Yes  Transportation Needs: High Risk (05/05/2023)   Received from [facility]   Transportation Needs    Within the past 12 months, has a lack of transportation kept you from medical appointments or from doing things needed for daily living?: Yes  Physical Activity: Inactive (05/06/2023)   Received from [facility]   Exercise Vital Sign    Days of Exercise per Week: 0 days    Minutes of Exercise per Session: 0 min  Stress: Stress Concern Present (05/06/2023)   Received from [facility]   [facility] - Occupational Stress Questionnaire    Feeling of Stress : Very much  Social Connections: Socially Isolated (05/06/2023)   Received from [facility]   Social Connection and Isolation Panel [NHANES]    Frequency of Communication with Friends and Family: Once a week    Frequency of Social Gatherings with Friends and Family: Never    Attends Religious Services: 1 to 4 times per year    Active Member of [employer] or Organizations: No    Attends Engineer, structural: Never    Marital Status: Never married   Social History   Tobacco Use  Smoking Status Every Day   Current packs/day: 1.00   Average packs/day: 1 pack/day for 15.0 years (15.0 ttl pk-yrs)   Types: Cigarettes  Smokeless Tobacco Current   Social History   Substance and Sexual Activity   Alcohol Use Yes   Comment: daily   Social History   Substance and Sexual Activity  Drug Use Yes   Types: Marijuana, Cocaine   Comment: ice, heroin    Additional pertinent information .  FAMILY HISTORY  No family history on file. Family Psychiatric History (if known):  brother committed suicide last year  MENTAL STATUS EXAM (MSE)  Presentation  General Appearance: Appropriate for Environment Eye Contact:Fair Speech:Clear and Coherent Speech Volume:Normal Handedness:No data recorded  Mood and Affect  Mood:Depressed; Hopeless; Worthless Affect:Congruent  Thought Process  Thought Processes:Coherent Descriptions of Associations:Intact  Orientation:Full (Time, Place and Person)  Thought Content:Logical  History of Schizophrenia/Schizoaffective disorder:No data recorded Duration of Psychotic Symptoms:No data recorded Hallucinations:Hallucinations: Auditory Description of Auditory Hallucinations: "I hear a baby cry all  the time"  Ideas of Reference:None  Suicidal Thoughts:Suicidal Thoughts: Yes, Active SI Active Intent and/or Plan: With Plan  Homicidal Thoughts:Homicidal Thoughts: No   Sensorium  Memory:Immediate Good; Recent Good; Remote Good Judgment:Fair Insight:Fair  Executive Functions  Concentration:Good Attention Span:Good Recall:Good Fund of Knowledge:Good Language:Good  Psychomotor Activity  Psychomotor Activity:Psychomotor Activity: Normal  Assets  Assets:Housing; Communication Skills  Sleep  Sleep:Sleep: Poor   VITALS  Blood pressure (!) 145/84, pulse 71, temperature 98.1 F (36.7 C), temperature source Oral, resp. rate 18, height 6\' 3"  (1.905 m), weight (!) 138.3 kg, SpO2 100%.  LABS  Admission on 07/01/2023  Component Date Value Ref Range Status   Sodium 07/01/2023 142  135 - 145 mmol/L Final   Potassium 07/01/2023 4.4  3.5 - 5.1 mmol/L Final   Chloride 07/01/2023 108  98 - 111 mmol/L Final   CO2 07/01/2023 24  22 - 32 mmol/L Final    Glucose, Bld 07/01/2023 90  70 - 99 mg/dL Final   Glucose reference range applies only to samples taken after fasting for at least 8 hours.   BUN 07/01/2023 12  6 - 20 mg/dL Final   Creatinine, Ser 07/01/2023 1.12  0.61 - 1.24 mg/dL Final   Calcium 16/05/9603 9.1  8.9 - 10.3 mg/dL Final   Total Protein 54/04/8118 7.4  6.5 - 8.1 g/dL Final   Albumin 14/78/2956 3.9  3.5 - 5.0 g/dL Final   AST 21/30/8657 39  15 - 41 U/L Final   ALT 07/01/2023 31  0 - 44 U/L Final   Alkaline Phosphatase 07/01/2023 61  38 - 126 U/L Final   Total Bilirubin 07/01/2023 0.3  <1.2 mg/dL Final   GFR, Estimated 07/01/2023 >60  >60 mL/min Final   Comment: (NOTE) Calculated using the CKD-EPI Creatinine Equation (2021)    Anion gap 07/01/2023 10  5 - 15 Final   Performed at [facility], [address], [place], [place] [address]   Alcohol, Ethyl (B) 07/01/2023 <10  <10 mg/dL Final   Comment: (NOTE) Lowest detectable limit for serum alcohol is 10 mg/dL.  For medical purposes only. Performed at [facility], [address] [place], [place] [address]    Salicylate Lvl 07/01/2023 <7.0 (L)  7.0 - 30.0 mg/dL Final   Performed at [facility], [address] [place], [place] [address]   Acetaminophen (Tylenol), Serum 07/01/2023 <10 (L)  10 - 30 ug/mL Final   Comment: (NOTE) Therapeutic concentrations vary significantly. A range of 10-30 ug/mL  may be an effective concentration for many patients. However, some  are best treated at concentrations outside of this range. Acetaminophen concentrations >150 ug/mL at 4 hours after ingestion  and >50 ug/mL at 12 hours after ingestion are often associated with  toxic reactions.  Performed at [facility], [address], [place], [place] [address]    WBC 07/01/2023 6.6  4.0 - 10.5 K/uL Final   RBC 07/01/2023 4.69  4.22 - 5.81 MIL/uL Final   Hemoglobin 07/01/2023 12.8 (L)  13.0 - 17.0 g/dL Final   HCT 72/53/6644 41.1  39.0 - 52.0 % Final   MCV  07/01/2023 87.6  80.0 - 100.0 fL Final   MCH 07/01/2023 27.3  26.0 - 34.0 pg Final   MCHC 07/01/2023 31.1  30.0 - 36.0 g/dL Final   RDW 03/47/4259 14.7  11.5 - 15.5 % Final   Platelets 07/01/2023 286  150 - 400 K/uL Final   nRBC 07/01/2023 0.0  0.0 - 0.2 % Final   Performed at [facility], [address], [place], [place] [address]   Lipase 07/01/2023 30  11 - 51 U/L Final   Performed at [facility], [address], [place], [place] [address]   B Natriuretic Peptide 07/01/2023 26.8  0.0 - 100.0 pg/mL Final   Performed at [facility], [address] [place], [place] [address]   Troponin I (High Sensitivity) 07/01/2023 14  <18 ng/L Final   Comment: (NOTE) Elevated high sensitivity troponin I (hsTnI) values and significant  changes across serial measurements may suggest ACS but many other  chronic and acute conditions are known to elevate hsTnI results.  Refer to the "Links" section for chest pain algorithms and additional  guidance. Performed at [facility], [address], [place], [place] [address]     PSYCHIATRIC REVIEW OF SYSTEMS (ROS)  Patient reports feeling depressed, hopeless and worthless. Having nightmares. Having auditory hallucinations described as hearing a baby cry at night. Having suicidal ideations with plan to overdose on his medications.  Additional findings:      Musculoskeletal: No abnormal movements observed      Gait & Station: Normal      Pain Screening: Denies      Nutrition & Dental Concerns: none reported  RISK FORMULATION/ASSESSMENT  Is the patient experiencing any suicidal or homicidal ideations: Yes       Explain if yes: Having suicidal ideations with plan to overdose on his medications. Protective factors considered for safety management: access to appropriate clinical interventions  Risk factors/concerns considered for safety management:  Prior attempt Family history of suicide Depression Recent  loss Hopelessness Male gender Unmarried  Is there a safety management plan with the patient and treatment team to minimize risk factors and promote protective factors: Yes           Explain: Admit to psych Is crisis care placement or psychiatric hospitalization recommended: Yes     Based on my current evaluation and risk assessment, patient is determined at this time to be at:  Moderate Risk  *RISK ASSESSMENT Risk assessment is a dynamic process; it is possible that this patient's condition, and risk level, may change. This should be re-evaluated and managed over time as appropriate. Please re-consult psychiatric consult services if additional assistance is needed in terms of risk assessment and management. If your team decides to discharge this patient, please advise the patient how to best access emergency psychiatric services, or to call 911, if their condition worsens or they feel unsafe in any way.  The patient is a 47 year old male with a history of PTSD, major depressive disorder, anxiety and suicidal ideation with plan to overdose on his medications. He reports persistent nightmares over the past three weeks despite being on Abilify, which he feels is ineffective. He has a significant family history of trauma, including the death of his one-year-old daughter five years ago and his brother's suicide last year. He currently resides in a halfway house and receives full disability benefits. He has a history of multiple suicide attempts, including cutting his wrists and taking an overdose of pills. He reports hearing a baby cry at night which exacerbates his insomnia.  The patient presents with severe depression, feelings of hopelessness, and worthlessness. He has poor coping skills and expresses a lack of stability due to ineffective medication. He is not currently in therapy and has not seen an outpatient psychiatrist. His sister has expressed  concern about his condition, noting that he talks  in his sleep and walks around at night. The patient feels overwhelmed by his symptoms and is seeking help to stabilize his condition.   Norval Morton, NP Telepsychiatry Consult Services

## 2023-07-02 NOTE — Progress Notes (Signed)
Pt admitted today from [facility] voluntarily. Pt reports suffering from PTSD after watching his brother kill himself and "everyone else who was in the house at my nieces birthday party. Pt reports also grieving the death of his 48 yr old daughter. Pt endorses for the past 3 weeks having suicidal thoughts. Pt has considered overdose as method. Pt reports a history of suicide attempt by overdose as well as cutting. Pt states he is originally from [place] and hopes to return there as he feels he has more support. Pt states recently we was living at [facility] house for substance use however left and relapsed on alcohol. Pt reports having been sober for a couple months but has been drinking for 2-3 days since relapse. Pt endorses anxiety and sweating, denies other withdrawal symptoms. Pt denies a/v hallucinations; does note after his daughter died he heard her cry at times. Pt states he does not plan to return to [facility], at this time is not interested in further substance use treatment. Pt considering a shelter until he can get back to [place].

## 2023-07-02 NOTE — Plan of Care (Signed)

## 2023-07-02 NOTE — ED Provider Notes (Signed)
Emergency Medicine Observation Re-evaluation Note  Eric Livingston is a 46 y.o. male, seen on rounds today.  Pt initially presented to the ED for complaints of Insomnia, Anxiety, Suicidal, Nausea, Emesis, and Abdominal Pain Currently, the patient is suicidal and awaiting behavioral health assessment.  Physical Exam  BP 135/75 (BP Location: Right Arm)   Pulse 84   Temp 98.2 F (36.8 C)   Resp 18   Ht 6\' 3"  (1.905 m)   Wt (!) 138.3 kg   SpO2 100%   BMI 38.12 kg/m  Physical Exam Alert and in no acute distress  ED Course / MDM  EKG:   I have reviewed the labs performed to date as well as medications administered while in observation.  Recent changes in the last 24 hours include none.  Plan  Current plan is for PVL specimens for suicidal ideations.    Bethann Berkshire, MD 07/02/23 1104

## 2023-07-03 ENCOUNTER — Encounter (HOSPITAL_COMMUNITY): Payer: Self-pay | Admitting: Psychiatry

## 2023-07-03 DIAGNOSIS — F332 Major depressive disorder, recurrent severe without psychotic features: Secondary | ICD-10-CM | POA: Diagnosis present

## 2023-07-03 MED ORDER — FUROSEMIDE 40 MG PO TABS
40.0000 mg | ORAL_TABLET | Freq: Once | ORAL | Status: AC
  Administered 2023-07-03: 40 mg via ORAL
  Filled 2023-07-03: qty 1

## 2023-07-03 MED ORDER — VITAMIN D3 25 MCG PO TABS
2000.0000 [IU] | ORAL_TABLET | Freq: Every day | ORAL | Status: DC
  Administered 2023-07-04 – 2023-07-08 (×5): 2000 [IU] via ORAL
  Filled 2023-07-03 (×7): qty 2

## 2023-07-03 MED ORDER — HYDROXYZINE HCL 50 MG PO TABS
50.0000 mg | ORAL_TABLET | Freq: Every day | ORAL | Status: DC
  Administered 2023-07-03 – 2023-07-07 (×5): 50 mg via ORAL
  Filled 2023-07-03 (×9): qty 1

## 2023-07-03 MED ORDER — VITAMIN D (ERGOCALCIFEROL) 1.25 MG (50000 UNIT) PO CAPS
50000.0000 [IU] | ORAL_CAPSULE | Freq: Once | ORAL | Status: AC
  Administered 2023-07-03: 50000 [IU] via ORAL
  Filled 2023-07-03: qty 1

## 2023-07-03 NOTE — Progress Notes (Signed)
   07/03/23 0626  15 Minute Checks  Location Dayroom  Visual Appearance Calm  Behavior Composed  Sleep (Behavioral Health Patients Only)  Calculate sleep? (Click Yes once per 24 hr at 0600 safety check) Yes  Documented sleep last 24 hours 7.25

## 2023-07-03 NOTE — [facility] Group Notes (Signed)
Type of Therapy and Topic:  Group Therapy: Gratitude  Participation Level:  Did Not Attend   Description of Group:   In this group, the patient participates in deep breathing exercise to help quiet down the room and memory game to get to know one another. Patients shared and discussed the importance of acknowledging the elements in their lives for which they are grateful and how this can positively impact their mood.  The group discussed how bringing the positive elements of their lives to the forefront of their minds can help bring positive impact to the physical and mental health.  An exercise was done as a group in which a list was made of gratitude items in order to encourage participants to consider other potential positives in their lives.  Therapeutic Goals: Patients will identify one or more item for which they are grateful in each of 6 categories:  people, experiences, things, places, skills, and other. Patients will discuss how it is possible to seek out gratitude in even bad situations. Patients will explore other possible items of gratitude that they could remember.   Summary of Patient Progress:  NA  Therapeutic Modalities:   Solution-Focused Therapy Activity

## 2023-07-03 NOTE — Progress Notes (Signed)
Patient stated he goes to pain clinic for back pain.  Gave patient tylenol for back pain #9

## 2023-07-03 NOTE — Progress Notes (Signed)
NO NOT Admit order obtained due to PTSD symptoms per [facility].

## 2023-07-03 NOTE — [facility] Group Notes (Signed)
[facility] Group Notes:  (Nursing)  Date:  07/03/2023  Time:  1330  Type of Therapy:  Nurse Education  Participation Level:  Did Not Attend    Shela Nevin 07/03/2023, 5:57 PM

## 2023-07-03 NOTE — Plan of Care (Signed)
  Problem: Education: Goal: Knowledge of [place] [facility] Education information/materials will improve Outcome: Progressing   Problem: Education: Goal: Verbalization of understanding the information provided will improve Outcome: Progressing   Problem: Activity: Goal: Sleeping patterns will improve Outcome: Progressing   Problem: Coping: Goal: Ability to verbalize frustrations and anger appropriately will improve Outcome: Progressing   Problem: Health Behavior/Discharge Planning: Goal: Identification of resources available to assist in meeting health care needs will improve Outcome: Progressing   Problem: Health Behavior/Discharge Planning: Goal: Compliance with treatment plan for underlying cause of condition will improve Outcome: Progressing   Problem: Health Behavior/Discharge Planning: Goal: Compliance with treatment plan for underlying cause of condition will improve Outcome: Progressing
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
## 2023-07-03 NOTE — Group Note (Signed)
Date:  07/03/2023 Time:  9:11 PM  Group Topic/Focus:  Wrap-Up Group:   The focus of this group is to help patients review their daily goal of treatment and discuss progress on daily workbooks.    Participation Level:  Active  Participation Quality:  Appropriate and Attentive  Affect:  Appropriate  Cognitive:  Alert and Appropriate  Insight: Appropriate and Good  Engagement in Group:  Engaged  Modes of Intervention:  Discussion and Education  Additional Comments:  Pt attended and participated in wrap up group this evening and rated their day a 7/10. Pt stated that their day started off slow and states that their BP has been high. Pt states that they have been trying to "figure out life" and [place] Pt would not elaborated about what their statements about the [place]" mean, but feels good about their meeting with the SW today. While they are here, pt would like to find "peace of mind".   Chrisandra Netters 07/03/2023, 9:11 PM

## 2023-07-03 NOTE — Progress Notes (Incomplete)
   07/03/23 2328  Psych Admission Type (Psych Patients Only)  Admission Status Voluntary  Psychosocial Assessment  Patient Complaints Depression  Eye Contact Brief  Facial Expression Sullen  Affect Flat  Speech Logical/coherent;Soft  Interaction Cautious  Motor Activity Other (Comment) (WDL)  Appearance/Hygiene Unremarkable  Behavior Characteristics Cooperative  Mood Depressed  Thought Process  Coherency WDL  Content WDL  Delusions None reported or observed  Perception WDL  Hallucination None reported or observed  Judgment Impaired  Confusion None  Danger to Self  Current suicidal ideation? Denies  Agreement Not to Harm Self Yes  Description of Agreement VERBAL CONTRACT FOR SAFETY  Danger to Others  Danger to Others None reported or observed   D: Patient in dayroom watching TV. Pt reports he is tolerating medication well and denies any side effects.  A: Medications administered as prescribed. Support and encouragement provided as needed.  R: Patient remains safe on the unit. Plan of care ongoing for safety and stability.

## 2023-07-03 NOTE — Plan of Care (Signed)
Nurse discussed anxiety, depression and coping skills with patient.  

## 2023-07-03 NOTE — Plan of Care (Addendum)
D:  Patient's self inventory sheet, patient has poor sleep, received sleep medication last night.  Good appetite, low energy level, poor concentration.  Rated depression, hopeless and anxiety #4.  Has experienced sweating today.  SI off/on, no SI during this time, contracts for safety.  Patient stated he feels tired today.  Physical, back pain #8.  [facility]  Stated he physically and mentally isolates.  Will probably go to shelter after discharge. A:  Medication administered per MD orders.  Emotional support and encouragement given pt. R:  Denied HI, contracts for safety.  SI off/on, but not today.  Denied A/V hallucinations.  Safety maintained with 15 minute safety checks.

## 2023-07-03 NOTE — Progress Notes (Signed)
   07/03/23 2328  Psych Admission Type (Psych Patients Only)  Admission Status Voluntary  Psychosocial Assessment  Patient Complaints Depression  Eye Contact Brief  Facial Expression Sullen  Affect Flat  Speech Logical/coherent;Soft  Interaction Cautious  Motor Activity Other (Comment) (WDL)  Appearance/Hygiene Unremarkable  Behavior Characteristics Cooperative  Mood Depressed  Thought Process  Coherency WDL  Content WDL  Delusions None reported or observed  Perception WDL  Hallucination None reported or observed  Judgment Impaired  Confusion None  Danger to Self  Current suicidal ideation? Denies  Agreement Not to Harm Self Yes  Description of Agreement VERBAL CONTRACT FOR SAFETY  Danger to Others  Danger to Others None reported or observed   D: Patient in dayroom watching TV. Pt reports he is tolerating medication well and denies any side effects.  A: Medications administered as prescribed. Support and encouragement provided as needed.  R: Patient remains safe on the unit. Plan of care ongoing for safety and stability.

## 2023-07-04 ENCOUNTER — Encounter (HOSPITAL_COMMUNITY): Payer: Self-pay

## 2023-07-04 DIAGNOSIS — F4310 Post-traumatic stress disorder, unspecified: Secondary | ICD-10-CM | POA: Diagnosis not present

## 2023-07-04 NOTE — Progress Notes (Signed)
     07/04/2023       2:34 PM   Eric Livingston   Type of Note: Shelter resources  Pt inquired this morning about getting a list of shelters [place] to begin looking at options. CSW provided list to patient. Pt declined [facility] due to "having a bad experience there, I don't want to go back to another one." CSW will continue to assist.  Signed:  Zaxton Angerer, LCSW-A 07/04/2023  2:34 PM

## 2023-07-04 NOTE — Plan of Care (Signed)
  Problem: Activity: Goal: Interest or engagement in activities will improve Outcome: Progressing Goal: Sleeping patterns will improve Outcome: Progressing   
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
## 2023-07-04 NOTE — [facility] Group Notes (Signed)
[facility] Group Notes:  (Nursing/MHT/Case Management/Adjunct)  Date:  07/04/2023  Time:  9:05 PM  Type of Therapy:  Psychoeducational Skills  Participation Level:  Active  Participation Quality:  Appropriate  Affect:  Appropriate  Cognitive:  Appropriate  Insight:  Good  Engagement in Group:  Engaged  Modes of Intervention:  Education  Summary of Progress/Problems: The patient rated his day as a 6.5 out of 10 since his day was quiet overall. He states that he went outside for recreation and to play basketball. He states that he wants to go to a shelter following discharge. His goal for tomorrow is to eat more.   Titiana Severa S 07/04/2023, 9:05 PM

## 2023-07-04 NOTE — Plan of Care (Signed)
  Problem: Activity: Goal: Interest or engagement in activities will improve Outcome: Progressing   Problem: Safety: Goal: Periods of time without injury will increase Outcome: Progressing   

## 2023-07-04 NOTE — Progress Notes (Signed)
   07/04/23 2120  Psych Admission Type (Psych Patients Only)  Admission Status Voluntary  Psychosocial Assessment  Patient Complaints Depression  Eye Contact Fair  Facial Expression Flat  Affect Flat  Speech Logical/coherent  Interaction Assertive  Motor Activity Other (Comment) (WDL)  Appearance/Hygiene Unremarkable  Behavior Characteristics Cooperative  Mood Depressed  Thought Process  Coherency WDL  Content WDL  Delusions None reported or observed  Perception WDL  Hallucination None reported or observed  Judgment Impaired  Confusion None  Danger to Self  Current suicidal ideation? Denies  Agreement Not to Harm Self Yes  Description of Agreement verbal  Danger to Others  Danger to Others None reported or observed

## 2023-07-04 NOTE — Group Note (Signed)
Recreation Therapy Group Note   Group Topic:Stress Management  Group Date: 07/04/2023 Start Time: 0940 End Time: 0955 Facilitators: Amberleigh Gerken-McCall, LRT,CTRS Location: [address] Dayroom   Group Topic: Stress Management   Goal Area(s) Addresses:  Patient will actively participate in stress management techniques presented during session.  Patient will successfully identify benefit of practicing stress management post d/c.   Behavioral Response: Appropriate  Intervention: Relaxation exercise with ambient sound and script   Group Description: Guided Imagery. LRT provided education, instruction, and demonstration on practice of visualization via guided imagery. Patient was asked to participate in the technique introduced during session. LRT debriefed including topics of mindfulness, stress management and specific scenarios each patient could use these techniques. Patients were given suggestions of ways to access scripts post d/c and encouraged to explore Youtube and other apps available on smartphones, tablets, and computers.  Education: Stress Management, Discharge Planning.   Education Outcome: Acknowledges education   Affect/Mood: N/A   Participation Level: Did not attend    Clinical Observations/Individualized Feedback:     Plan: Continue to engage patient in RT group sessions 2-3x/week.   Larence Thone-McCall, LRT,CTRS 07/04/2023 1:00 PM

## 2023-07-04 NOTE — Group Note (Signed)
Date:  07/04/2023 Time:  9:31 AM  Group Topic/Focus:  Goals Group:   The focus of this group is to help patients establish daily goals to achieve during treatment and discuss how the patient can incorporate goal setting into their daily lives to aide in recovery.    Participation Level:  Did Not Attend  Participation Quality:      Affect:      Cognitive:      Insight: None  Engagement in Group:      Modes of Intervention:      Additional Comments:    Beckie Busing 07/04/2023, 9:31 AM

## 2023-07-04 NOTE — Progress Notes (Signed)
   07/04/23 0756  Psych Admission Type (Psych Patients Only)  Admission Status Voluntary  Psychosocial Assessment  Patient Complaints Anxiety  Eye Contact Fair  Facial Expression Flat  Affect Flat  Speech Logical/coherent  Interaction Assertive  Motor Activity Other (Comment) (WDL)  Appearance/Hygiene Unremarkable  Behavior Characteristics Cooperative;Appropriate to situation  Mood Pleasant;Depressed  Thought Process  Coherency WDL  Content WDL  Delusions None reported or observed  Perception WDL  Hallucination None reported or observed  Judgment Impaired  Confusion None  Danger to Self  Current suicidal ideation? Denies  Agreement Not to Harm Self Yes  Description of Agreement Verbal  Danger to Others  Danger to Others None reported or observed

## 2023-07-04 NOTE — BH IP Treatment Plan (Signed)
Interdisciplinary Treatment and Diagnostic Plan Update  07/04/2023 Time of Session: 10:35AM Eric Livingston MRN: [id number]  Principal Diagnosis: PTSD (post-traumatic stress disorder)  Secondary Diagnoses: Principal Problem:   PTSD (post-traumatic stress disorder) Active Problems:   Polysubstance dependence (HCC)   Suicidal thoughts   Essential hypertension, benign   Major depressive disorder, recurrent episode, severe (HCC)   Current Medications:  Current Facility-Administered Medications  Medication Dose Route Frequency Provider Last Rate Last Admin   acetaminophen (TYLENOL) tablet 650 mg  650 mg Oral Q6H PRN Lenox Ponds, NP   650 mg at 07/03/23 1524   alum & mag hydroxide-simeth (MAALOX/MYLANTA) 200-200-20 MG/5ML suspension 30 mL  30 mL Oral Q4H PRN Lenox Ponds, NP       amLODipine (NORVASC) tablet 5 mg  5 mg Oral Daily Lenox Ponds, NP   5 mg at 07/04/23 0756   haloperidol (HALDOL) tablet 5 mg  5 mg Oral TID PRN Lenox Ponds, NP       And   diphenhydrAMINE (BENADRYL) capsule 50 mg  50 mg Oral TID PRN Lenox Ponds, NP       hydrOXYzine (ATARAX) tablet 50 mg  50 mg Oral Q6H PRN Jearld Lesch, NP   50 mg at 07/03/23 1702   hydrOXYzine (ATARAX) tablet 50 mg  50 mg Oral QHS Golda Acre, MD   50 mg at 07/03/23 2138   magnesium hydroxide (MILK OF MAGNESIA) suspension 30 mL  30 mL Oral Daily PRN Lenox Ponds, NP       metoprolol succinate (TOPROL-XL) 24 hr tablet 50 mg  50 mg Oral Daily Lenox Ponds, NP   50 mg at 07/04/23 0756   nicotine polacrilex (NICORETTE) gum 2 mg  2 mg Oral PRN Abbott Pao, Nadir, MD       prazosin (MINIPRESS) capsule 1 mg  1 mg Oral QHS Lenox Ponds, NP   1 mg at 07/03/23 2138   sertraline (ZOLOFT) tablet 50 mg  50 mg Oral Daily Lenox Ponds, NP   50 mg at 07/04/23 0756   vitamin D3 (CHOLECALCIFEROL) tablet 2,000 Units  2,000 Units Oral Daily Golda Acre, MD   2,000 Units at 07/04/23 0756   PTA  Medications: Medications Prior to Admission  Medication Sig Dispense Refill Last Dose   albuterol (PROVENTIL HFA;VENTOLIN HFA) 108 (90 Base) MCG/ACT inhaler Inhale 2 puffs into the lungs every 6 (six) hours as needed for wheezing or shortness of breath. (Patient not taking: Reported on 07/01/2023)      amLODipine (NORVASC) 5 MG tablet Take 5 mg by mouth daily. (Patient not taking: Reported on 07/01/2023)      ARIPiprazole (ABILIFY) 5 MG tablet Take 5 mg by mouth daily. (Patient not taking: Reported on 07/01/2023)      atorvastatin (LIPITOR) 20 MG tablet Take 20 mg by mouth daily. (Patient not taking: Reported on 07/01/2023)      citalopram (CELEXA) 20 MG tablet Take 1 tablet (20 mg total) by mouth daily. (Patient not taking: Reported on 05/17/2018) 30 tablet 0    cloNIDine (CATAPRES) 0.1 MG tablet Take 1 tablet (0.1 mg total) by mouth at bedtime. (Patient not taking: Reported on 05/17/2018) 30 tablet 0    cyanocobalamin (VITAMIN B12) 1000 MCG tablet Take 1 tablet by mouth daily. (Patient not taking: Reported on 07/01/2023)      divalproex (DEPAKOTE ER) 250 MG 24 hr tablet Take 3 tablets (750 mg total) by mouth at bedtime. (Patient not  taking: Reported on 05/17/2018) 90 tablet 0    escitalopram (LEXAPRO) 20 MG tablet Take 20 mg by mouth daily. (Patient not taking: Reported on 07/01/2023)      furosemide (LASIX) 20 MG tablet Take 20 mg by mouth daily. (Patient not taking: Reported on 07/01/2023)      gabapentin (NEURONTIN) 300 MG capsule Take 300 mg by mouth 3 (three) times daily. (Patient not taking: Reported on 07/01/2023)      hydrOXYzine (VISTARIL) 100 MG capsule Take 100 mg by mouth at bedtime. (Patient not taking: Reported on 07/01/2023)      metoprolol succinate (TOPROL-XL) 50 MG 24 hr tablet Take 50 mg by mouth daily. (Patient not taking: Reported on 07/01/2023)      prazosin (MINIPRESS) 1 MG capsule Take 1 mg by mouth 3 (three) times daily. (Patient not taking: Reported on 07/01/2023)       QUEtiapine (SEROQUEL) 400 MG tablet Take 1 tablet (400 mg total) by mouth at bedtime. (Patient not taking: Reported on 05/17/2018) 30 tablet 0    Vitamin D, Ergocalciferol, (DRISDOL) 1.25 MG (50000 UNIT) CAPS capsule Take 50,000 Units by mouth once a week. (Patient not taking: Reported on 07/01/2023)       Patient Stressors:    Patient Strengths:    Treatment Modalities: Medication Management, Group therapy, Case management,  1 to 1 session with clinician, Psychoeducation, Recreational therapy.   Physician Treatment Plan for Primary Diagnosis: PTSD (post-traumatic stress disorder) Long Term Goal(s):     Short Term Goals:    Medication Management: Evaluate patient's response, side effects, and tolerance of medication regimen.  Therapeutic Interventions: 1 to 1 sessions, Unit Group sessions and Medication administration.  Evaluation of Outcomes: Not Progressing  Physician Treatment Plan for Secondary Diagnosis: Principal Problem:   PTSD (post-traumatic stress disorder) Active Problems:   Polysubstance dependence (HCC)   Suicidal thoughts   Essential hypertension, benign   Major depressive disorder, recurrent episode, severe (HCC)  Long Term Goal(s):     Short Term Goals:       Medication Management: Evaluate patient's response, side effects, and tolerance of medication regimen.  Therapeutic Interventions: 1 to 1 sessions, Unit Group sessions and Medication administration.  Evaluation of Outcomes: Not Progressing   RN Treatment Plan for Primary Diagnosis: PTSD (post-traumatic stress disorder) Long Term Goal(s): Knowledge of disease and therapeutic regimen to maintain health will improve  Short Term Goals: Ability to remain free from injury will improve, Ability to verbalize frustration and anger appropriately will improve, Ability to demonstrate self-control, Ability to participate in decision making will improve, Ability to verbalize feelings will improve, Ability to  disclose and discuss suicidal ideas, Ability to identify and develop effective coping behaviors will improve, and Compliance with prescribed medications will improve  Medication Management: RN will administer medications as ordered by provider, will assess and evaluate patient's response and provide education to patient for prescribed medication. RN will report any adverse and/or side effects to prescribing provider.  Therapeutic Interventions: 1 on 1 counseling sessions, Psychoeducation, Medication administration, Evaluate responses to treatment, Monitor vital signs and CBGs as ordered, Perform/monitor CIWA, COWS, AIMS and Fall Risk screenings as ordered, Perform wound care treatments as ordered.  Evaluation of Outcomes: Not Progressing   LCSW Treatment Plan for Primary Diagnosis: PTSD (post-traumatic stress disorder) Long Term Goal(s): Safe transition to appropriate next level of care at discharge, Engage patient in therapeutic group addressing interpersonal concerns.  Short Term Goals: Engage patient in aftercare planning with referrals and resources, Increase social  support, Increase ability to appropriately verbalize feelings, Increase emotional regulation, Facilitate acceptance of mental health diagnosis and concerns, Facilitate patient progression through stages of change regarding substance use diagnoses and concerns, Identify triggers associated with mental health/substance abuse issues, and Increase skills for wellness and recovery  Therapeutic Interventions: Assess for all discharge needs, 1 to 1 time with Social worker, Explore available resources and support systems, Assess for adequacy in community support network, Educate family and significant other(s) on suicide prevention, Complete Psychosocial Assessment, Interpersonal group therapy.  Evaluation of Outcomes: Not Progressing   Progress in Treatment: Attending groups: No. Participating in groups: No. Taking medication as  prescribed: Yes. Toleration medication: Yes. Family/Significant other contact made: No, will contact:  pt declined consents Patient understands diagnosis: Yes. Discussing patient identified problems/goals with staff: Yes. Medical problems stabilized or resolved: Yes. Denies suicidal/homicidal ideation: Yes. Issues/concerns per patient self-inventory: No.   New problem(s) identified: No, Describe:  none reported  New Short Term/Long Term Goal(s): medication stabilization, elimination of SI thoughts, development of comprehensive mental wellness plan.    Patient Goals:  "Get back on my medication, get stable"  Discharge Plan or Barriers: Patient recently admitted. CSW will continue to follow and assess for appropriate referrals and possible discharge planning.    Reason for Continuation of Hospitalization: Depression Medication stabilization Suicidal ideation  Estimated Length of Stay: 5-7 days  Last 3 [place] Suicide Severity Risk Score: Flowsheet Row Ad[facility] (Current) [facility] 07[facility]01/2023 in [facility] Department [facility] ED from 05/17/2018 in [facility] Department [facility]  C-SSRS RISK CATEGORY Moderate Risk Moderate Risk High Risk       Last [facility] 2/9 Scores:     No data to display          Scribe for Treatment Team: Kathi Der, LCSWA 07/04/2023 1:10 PM

## 2023-07-04 NOTE — [facility] Group Notes (Signed)

## 2023-07-04 NOTE — Plan of Care (Signed)
°  Problem: Education: °Goal: Emotional status will improve °Outcome: Progressing °Goal: Mental status will improve °Outcome: Progressing °Goal: Verbalization of understanding the information provided will improve °Outcome: Progressing °  °

## 2023-07-04 NOTE — Group Note (Signed)
Date:  07/04/2023 Time:  11:09 AM  Group Topic/Focus:  Wellness Toolbox:   The focus of this group is to discuss various aspects of wellness, balancing those aspects and exploring ways to increase the ability to experience wellness.  Patients will create a wellness toolbox for use upon discharge.    Participation Level:  Active  Participation Quality:  Appropriate  Affect:  Appropriate  Cognitive:  Alert  Insight: Appropriate  Engagement in Group:  Engaged  Modes of Intervention:  Discussion  Additional Comments:    Beckie Busing 07/04/2023, 11:09 AM

## 2023-07-05 ENCOUNTER — Encounter (HOSPITAL_COMMUNITY): Payer: Self-pay | Admitting: Psychiatry

## 2023-07-05 DIAGNOSIS — F4310 Post-traumatic stress disorder, unspecified: Secondary | ICD-10-CM | POA: Diagnosis not present

## 2023-07-05 NOTE — Plan of Care (Signed)
  Problem: Education: Goal: Knowledge of [facility] Education information/materials will improve Outcome: Progressing Goal: Emotional status will improve Outcome: Progressing Goal: Mental status will improve Outcome: Progressing Goal: Verbalization of understanding the information provided will improve Outcome: Progressing   
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
## 2023-07-05 NOTE — Plan of Care (Signed)
  Problem: Education: Goal: Emotional status will improve Outcome: Progressing Goal: Mental status will improve Outcome: Progressing Goal: Verbalization of understanding the information provided will improve Outcome: Progressing   Problem: Activity: Goal: Interest or engagement in activities will improve Outcome: Progressing   Problem: Coping: Goal: Ability to demonstrate self-control will improve Outcome: Progressing   Problem: Safety: Goal: Periods of time without injury will increase Outcome: Progressing

## 2023-07-05 NOTE — Progress Notes (Signed)
   07/05/23 0559  15 Minute Checks  Location Bedroom  Visual Appearance Calm  Behavior Sleeping  Sleep (Behavioral Health Patients Only)  Calculate sleep? (Click Yes once per 24 hr at 0600 safety check) Yes  Documented sleep last 24 hours 6.75

## 2023-07-05 NOTE — Progress Notes (Signed)
Adult Psychoeducational Group Note  Date:  07/05/2023 Time:  11:45 PM  Group Topic/Focus:  Wrap-Up Group:   The focus of this group is to help patients review their daily goal of treatment and discuss progress on daily workbooks.  Participation Level:  Active  Participation Quality:  Appropriate  Affect:  Appropriate  Cognitive:  Appropriate  Insight: Appropriate  Engagement in Group:  Engaged  Modes of Intervention:  Discussion  Additional Comments:  Pt stated his goal for the day was to get some rest. Pt met goal.  Wynema Birch D 07/05/2023, 11:45 PM

## 2023-07-05 NOTE — Group Note (Signed)
Recreation Therapy Group Note   Group Topic:Animal Assisted Therapy   Group Date: 07/05/2023 Start Time: 0950 End Time: 1030 Facilitators: Floyed Masoud-McCall, LRT,CTR Location: [address] Dayroom   Animal-Assisted Activity (AAA) Program Checklist/Progress Notes Patient Eligibility Criteria Checklist & Daily Group note for Rec Tx Intervention  AAA/T Program Assumption of Risk Form signed by Patient/ or Parent Legal Guardian Yes  Patient is free of allergies or severe asthma Yes  Patient reports no fear of animals Yes  Patient reports no history of cruelty to animals Yes  Patient understands his/her participation is voluntary Yes  Patient washes hands before animal contact Yes  Patient washes hands after animal contact Yes  Education: Hand Washing, Appropriate Animal Interaction   Education Outcome: Acknowledges education.    Affect/Mood: Appropriate   Participation Level: Moderate   Participation Quality: Independent   Behavior: Appropriate   Speech/Thought Process: Focused   Insight: Good   Judgement: Good   Modes of Intervention: Teaching laboratory technician   Patient Response to Interventions:  Attentive   Education Outcome:  In group clarification offered    Clinical Observations/Individualized Feedback: Pt attended group session. Pt was attentive to peers and volunteer as they shared stories about their pets and the training dogs go through to be therapy dogs.    Plan: Continue to engage patient in RT group sessions 2-3x/week.   Ger Nicks-McCall, LRT,CTRS 07/05/2023 1:32 PM

## 2023-07-05 NOTE — Progress Notes (Addendum)
     07/05/2023       1:31 PM   Eric Livingston   Type of Note: [facility]  Spoke with Dispensing optician for [facility]. Requested intake team to do an assessment for patient, per patient's request. Asencion Islam stated that 2 other patients at [facility] have intakes scheduled for tomorrow morning at 10:00AM. Asencion Islam will call back this CSW to confirm if this pt can also get an assessment tomorrow. If not, assessment can take place Thursday 11/21. CSW will continue to assist. Spoke with patient about this, pt is in agreement.   Signed:  Leighanne Adolph, LCSW-A 07/05/2023  1:31 PM

## 2023-07-05 NOTE — Group Note (Signed)
[facility] LCSW Group Therapy Note   Group Date: 07/05/2023 Start Time: 1100 End Time: 1200   Type of Therapy and Topic: Group Therapy: Avoiding Self-Sabotaging and Enabling Behaviors  Participation Level: Active  Mood:  Description of Group:  In this group, patients will learn how to identify obstacles, self-sabotaging and enabling behaviors, as well as: what are they, why do we do them and what needs these behaviors meet. Discuss unhealthy relationships and how to have positive healthy boundaries with those that sabotage and enable. Explore aspects of self-sabotage and enabling in yourself and how to limit these self-destructive behaviors in everyday life.   Therapeutic Goals: 1. Patient will identify one obstacle that relates to self-sabotage and enabling behaviors 2. Patient will identify one personal self-sabotaging or enabling behavior they did prior to admission 3. Patient will state a plan to change the above identified behavior 4. Patient will demonstrate ability to communicate their needs through discussion and/or role play.    Summary of Patient Progress:   During today's group therapy session, the patient demonstrated significant progress in their understanding of personal challenges and emotional patterns. They showed good insight into how past experiences and thought processes contribute to current struggles, particularly in areas such as interpersonal relationships and stress management. The patient actively participated in group discussions, reflecting on their behaviors and showing an openness to feedback from peers and the therapist.   Therapeutic Modalities:  Cognitive Behavioral Therapy Person-Centered Therapy Motivational Interviewing    Marinda Elk, LCSW

## 2023-07-05 NOTE — Progress Notes (Signed)
Eric Livingston, is in bed but awake.Calm flat and cooperative Denies SI/HI/AVH and denies back pain currently.Hypertensive 149/109 HR 88 early this am 151/99 HR 91 Taking scheduled meds Will continue to monitor B/P

## 2023-07-06 DIAGNOSIS — F4310 Post-traumatic stress disorder, unspecified: Secondary | ICD-10-CM | POA: Diagnosis not present

## 2023-07-06 MED ORDER — IBUPROFEN 400 MG PO TABS
400.0000 mg | ORAL_TABLET | Freq: Three times a day (TID) | ORAL | Status: DC
  Filled 2023-07-06 (×6): qty 1

## 2023-07-06 MED ORDER — WHITE PETROLATUM EX OINT
TOPICAL_OINTMENT | CUTANEOUS | Status: AC
  Administered 2023-07-06: 1
  Filled 2023-07-06: qty 5

## 2023-07-06 MED ORDER — IBUPROFEN 600 MG PO TABS
600.0000 mg | ORAL_TABLET | Freq: Four times a day (QID) | ORAL | Status: DC | PRN
  Administered 2023-07-06 – 2023-07-07 (×5): 600 mg via ORAL
  Filled 2023-07-06 (×5): qty 1

## 2023-07-06 NOTE — Progress Notes (Signed)
   07/06/23 2050  Psych Admission Type (Psych Patients Only)  Admission Status Voluntary  Psychosocial Assessment  Patient Complaints Depression  Eye Contact Fair  Facial Expression Flat  Affect Flat  Speech Logical/coherent  Interaction Assertive;Minimal  Motor Activity Other (Comment) (WNL)  Appearance/Hygiene Unremarkable  Behavior Characteristics Cooperative  Mood Depressed  Thought Process  Coherency WDL  Content WDL  Delusions None reported or observed  Perception WDL  Hallucination None reported or observed  Judgment Impaired  Confusion None  Danger to Self  Current suicidal ideation? Denies  Agreement Not to Harm Self Yes  Description of Agreement verbal  Danger to Others  Danger to Others None reported or observed

## 2023-07-06 NOTE — Progress Notes (Signed)
   07/05/23 2200  Psych Admission Type (Psych Patients Only)  Admission Status Voluntary  Psychosocial Assessment  Patient Complaints Depression  Eye Contact Fair  Facial Expression Flat  Affect Flat  Speech Logical/coherent  Interaction Assertive  Motor Activity Other (Comment) (WDL)  Appearance/Hygiene Unremarkable  Behavior Characteristics Cooperative  Mood Depressed  Thought Process  Coherency WDL  Content WDL  Delusions None reported or observed  Perception WDL  Hallucination None reported or observed  Judgment Impaired  Confusion None  Danger to Self  Current suicidal ideation? Denies  Agreement Not to Harm Self Yes  Description of Agreement Verbal  Danger to Others  Danger to Others None reported or observed

## 2023-07-06 NOTE — Plan of Care (Signed)
  Problem: Safety: Goal: Periods of time without injury will increase Outcome: Progressing   Problem: Coping: Goal: Will verbalize feelings Outcome: Progressing
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
## 2023-07-06 NOTE — [facility] Group Notes (Signed)
[facility] Group Notes:  (Nursing/MHT/Case Management/Adjunct)  Date:  07/06/2023  Time:  9:28 PM  Type of Therapy:   NA group  Participation Level:  Did Not Attend  Participation Quality:    Affect:    Cognitive:    Insight:    Engagement in Group:    Modes of Intervention:    Summary of Progress/Problems: Pt didn't attend.  Noah Delaine 07/06/2023, 9:28 PM

## 2023-07-06 NOTE — Progress Notes (Addendum)
     07/06/2023       11:56 AM   Eric Livingston   Type of Note: [facility] ACTT  Pt is currently meeting with ACTT team doing intake assessment, CSW will continue to assist regarding decision.  2:00PM: Spoke with patient this afternoon who stated that he was picked up by this ACTT team and is looking forward to working with them. Pt advised the ACTT team that his [facility] will be transferred to [facility] on 07/17/23, pt needs a place to stay until then. ACTT team will assist with this as well.    Signed:  Kaylynn Chamblin, LCSW-A 07/06/2023  11:56 AM

## 2023-07-06 NOTE — Progress Notes (Signed)
D: Patient is alert, oriented, and cooperative. Denies SI, HI, AVH, and verbally contracts for safety. Patient reports he slept fair last night. Patient reports his appetite as fair, energy level as low, and concentration as poor. Patient rates his depression 6/10, hopelessness 6/10, and anxiety 6/10. Patient reports low back pain.    A: Scheduled medications administered per MD order. PRN ibuprofen administered. Support provided. Patient educated on safety on the unit and medications. Routine safety checks every 15 minutes. Patient stated understanding to tell nurse about any new physical symptoms. Patient understands to tell staff of any needs.     R: No adverse drug reactions noted. Patient remains safe at this time and will continue to monitor.    07/06/23 1000  Psych Admission Type (Psych Patients Only)  Admission Status Voluntary  Psychosocial Assessment  Patient Complaints Depression  Eye Contact Fair  Facial Expression Flat  Affect Flat  Speech Logical/coherent  Interaction Assertive  Motor Activity Other (Comment) (WNL)  Appearance/Hygiene Unremarkable  Behavior Characteristics Cooperative  Mood Depressed  Thought Process  Coherency WDL  Content WDL  Delusions None reported or observed  Perception WDL  Hallucination None reported or observed  Judgment Impaired  Confusion None  Danger to Self  Current suicidal ideation? Denies  Agreement Not to Harm Self Yes  Description of Agreement verbal  Danger to Others  Danger to Others None reported or observed

## 2023-07-06 NOTE — Group Note (Signed)
Date:  07/06/2023 Time:  9:45 AM  Group Topic/Focus:  Orientation:   The focus of this group is to educate the patient on the purpose and policies of crisis stabilization and provide a format to answer questions about their admission.  The group details unit policies and expectations of patients while admitted.    Participation Level:  Active  Participation Quality:  Appropriate  Affect:  Appropriate  Cognitive:  Appropriate  Insight: Appropriate  Engagement in Group:  Engaged  Modes of Intervention:  Discussion  Additional Comments:     Reymundo Poll 07/06/2023, 9:45 AM

## 2023-07-06 NOTE — Group Note (Signed)
Recreation Therapy Group Note   Group Topic:Team Building  Group Date: 07/06/2023 Start Time: 0935 End Time: 1002 Facilitators: Elvyn Krohn-McCall, LRT,CTRS Location: [address] Dayroom   Group Topic: Communication, Team Building, Problem Solving  Goal Area(s) Addresses:  Patient will effectively work with peer towards shared goal.  Patient will identify skills used to make activity successful.  Patient will identify how skills used during activity can be applied to reach post d/c goals.   Intervention: STEM Activity- Glass blower/designer  Group Description: Tallest Pharmacist, community. In teams of 5-6, patients were given 11 craft pipe cleaners. Using the materials provided, patients were instructed to compete again the opposing team(s) to build the tallest free-standing structure from floor level. The activity was timed; difficulty increased by Clinical research associate as Production designer, theatre/television/film continued.  Systematically resources were removed with additional directions for example, placing one arm behind their back, working in silence, and shape stipulations. LRT facilitated post-activity discussion reviewing team processes and necessary communication skills involved in completion. Patients were encouraged to reflect how the skills utilized, or not utilized, in this activity can be incorporated to positively impact support systems post discharge.  Education: Pharmacist, community, Scientist, physiological, Discharge Planning   Education Outcome: Acknowledges education/In group clarification offered/Needs additional education.    Affect/Mood: Appropriate   Participation Level: None   Participation Quality: None   Behavior: On-looking   Speech/Thought Process: None   Insight: None   Judgement: None   Modes of Intervention: STEM Activity   Patient Response to Interventions:  Disengaged   Education Outcome:  In group clarification offered    Clinical Observations/Individualized Feedback: Pt didn't  participate in group session. Pt observed as peers completed activity.    Plan: Continue to engage patient in RT group sessions 2-3x/week.   Eual Lindstrom-McCall, LRT,CTRS  07/06/2023 12:15 PM

## 2023-07-07 DIAGNOSIS — F4310 Post-traumatic stress disorder, unspecified: Secondary | ICD-10-CM

## 2023-07-07 MED ORDER — CLONIDINE HCL 0.1 MG PO TABS
0.1000 mg | ORAL_TABLET | ORAL | Status: DC | PRN
  Administered 2023-07-07 – 2023-07-08 (×3): 0.1 mg via ORAL
  Filled 2023-07-07 (×3): qty 1

## 2023-07-07 NOTE — Plan of Care (Signed)
  Problem: Education: Goal: Knowledge of [facility] Education information/materials will improve Outcome: Progressing Goal: Emotional status will improve Outcome: Progressing Goal: Mental status will improve Outcome: Progressing   
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
## 2023-07-07 NOTE — Progress Notes (Addendum)
D. Pt presents with a sad affect, depressed mood- calm cooperative behavior- per pt's self inventory,  pt rated his depression,hopelessness and anxiety all 6's on his self inventory. Pt continues to complain of chronic back pain, rated 6/10 this am, reporting that he didn't sleep well. Pt has been visible in the milieu, observed attending group. Pt's stated goal is "to work on my discharge plan." Pt currently denies SI/HI and AVH  A. Labs and vitals monitored. Pt given and educated on medications. Pt given prn clonidine for elevated BP :159/109-  BP decreased to 115/66 following medication.  Pt supported emotionally and encouraged to express concerns and ask questions.   R. Pt remains safe with 15 minute checks. Will continue POC.    07/07/23 0900  Psych Admission Type (Psych Patients Only)  Admission Status Voluntary  Psychosocial Assessment  Patient Complaints Depression  Eye Contact Fair  Facial Expression Sad  Affect Depressed  Speech Logical/coherent  Interaction Assertive  Motor Activity Other (Comment) (steady gait)  Appearance/Hygiene Unremarkable  Behavior Characteristics Cooperative  Mood Depressed  Thought Process  Coherency WDL  Content WDL  Delusions None reported or observed  Perception WDL  Hallucination None reported or observed  Judgment Impaired  Confusion None  Danger to Self  Current suicidal ideation? Denies  Agreement Not to Harm Self Yes  Description of Agreement agreed to contact staff before acting on harmful thoughts  Danger to Others  Danger to Others None reported or observed

## 2023-07-07 NOTE — Progress Notes (Signed)
     07/07/2023       1:37 PM   Eric Livingston   Type of Note: [employer] to patient regarding discharge tomorrow. Pt states having a phone conversation with [facility] who has open beds. Patient has to be outside of facility by 5:00PM Friday to ensure a bed. CSW will arrange taxi tomorrow for after lunch. Pt agreeable. Pt states "I will call the ACTT team when I leave here too to let them know where I am going." CSW will continue to assist.  Signed:  Kassity Woodson, LCSW-A 07/07/2023  1:37 PM

## 2023-07-07 NOTE — Progress Notes (Signed)
   07/07/23 0554  15 Minute Checks  Location Bedroom  Visual Appearance Calm  Behavior Sleeping  Sleep (Behavioral Health Patients Only)  Calculate sleep? (Click Yes once per 24 hr at 0600 safety check) Yes  Documented sleep last 24 hours 10

## 2023-07-07 NOTE — Group Note (Signed)
LCSW Group Therapy Note   Group Date: 07/07/2023 Start Time: 1100 End Time: 1200   Type of Therapy and Topic:  Group Therapy: Boundaries  Participation Level:  Did Not Attend  Description of Group: This group will address the use of boundaries in their personal lives. Patients will explore why boundaries are important, the difference between healthy and unhealthy boundaries, and negative and postive outcomes of different boundaries and will look at how boundaries can be crossed.  Patients will be encouraged to identify current boundaries in their own lives and identify what kind of boundary is being set. Facilitators will guide patients in utilizing problem-solving interventions to address and correct types boundaries being used and to address when no boundary is being used. Understanding and applying boundaries will be explored and addressed for obtaining and maintaining a balanced life. Patients will be encouraged to explore ways to assertively make their boundaries and needs known to significant others in their lives, using other group members and facilitator for role play, support, and feedback.  Therapeutic Goals:  1.  Patient will identify areas in their life where setting clear boundaries could be  used to improve their life.  2.  Patient will identify signs/triggers that a boundary is not being respected. 3.  Patient will identify two ways to set boundaries in order to achieve balance in  their lives: 4.  Patient will demonstrate ability to communicate their needs and set boundaries  through discussion and/or role plays  Summary of Patient Progress:  Did not attend  Therapeutic Modalities:   Cognitive Behavioral Therapy Solution-Focused Therapy  Kathi Der, LCSWA 07/07/2023  12:09 PM

## 2023-07-07 NOTE — [facility] Group Notes (Signed)
[facility] Group Notes:  (Nursing/MHT/Case Management/Adjunct)  Date:  07/07/2023  Time:  10:21 PM  Type of Therapy:   Wrap-up group  Participation Level:  Did Not Attend  Participation Quality:    Affect:    Cognitive:    Insight:    Engagement in Group:    Modes of Intervention:    Summary of Progress/Problems: Pt refused to attend group. Writer provided him a Engineer, site to work on in his room.   Noah Delaine 07/07/2023, 10:21 PM

## 2023-07-07 NOTE — [facility] Suicide Risk Assessment (Signed)
[facility] INPATIENT:  Family/Significant Other Suicide Prevention Education  Suicide Prevention Education:  Patient Refusal for Family/Significant Other Suicide Prevention Education: The patient Eric Livingston has refused to provide written consent for family/significant other to be provided Family/Significant Other Suicide Prevention Education during admission and/or prior to discharge.  Physician notified.  Kathi Der 07/07/2023, 1:44 PM

## 2023-07-07 NOTE — Progress Notes (Addendum)
Patient is requesting to be referred to a pain clinic before being discharged,stating that he was going to one in [place] before moving to [place].

## 2023-07-07 NOTE — Group Note (Signed)
Date:  07/07/2023 Time:  9:28 AM  Group Topic/Focus:  Goals Group:   The focus of this group is to help patients establish daily goals to achieve during treatment and discuss how the patient can incorporate goal setting into their daily lives to aide in recovery. Orientation:   The focus of this group is to educate the patient on the purpose and policies of crisis stabilization and provide a format to answer questions about their admission.  The group details unit policies and expectations of patients while admitted.    Participation Level:  Active  Participation Quality:  Appropriate  Affect:  Appropriate  Cognitive:  Appropriate  Insight: Appropriate  Engagement in Group:  Engaged  Modes of Intervention:  Discussion  Additional Comments:    Keyari Kleeman D Freddrick Gladson 07/07/2023, 9:28 AM

## 2023-07-08 DIAGNOSIS — F4310 Post-traumatic stress disorder, unspecified: Secondary | ICD-10-CM | POA: Diagnosis not present

## 2023-07-08 MED ORDER — NICOTINE POLACRILEX 2 MG MT GUM: 2.0000 mg | CHEWING_GUM | OROMUCOSAL | 0 refills | Status: AC | PRN

## 2023-07-08 MED ORDER — SERTRALINE HCL 50 MG PO TABS: 50.0000 mg | ORAL_TABLET | Freq: Every day | ORAL | 0 refills | Status: AC

## 2023-07-08 NOTE — Group Note (Signed)
Date:  07/08/2023 Time:  1:41 PM  Group Topic/Focus:  Orientation:   The focus of this group is to educate the patient on the purpose and policies of crisis stabilization and provide a format to answer questions about their admission.  The group details unit policies and expectations of patients while admitted.    Participation Level:  Active  Participation Quality:  Appropriate  Affect:  Appropriate  Cognitive:  Appropriate  Insight: Appropriate  Engagement in Group:  Engaged  Modes of Intervention:  Discussion  Additional Comments:     Reymundo Poll 07/08/2023, 1:41 PM

## 2023-07-08 NOTE — Progress Notes (Signed)
  [facility] Adult Case Management Discharge Plan :  Will you be returning to the same living situation after discharge:  No. Pt will be going to [facility] at discharge At discharge, do you have transportation home?: Yes,  CSW to arrange [employer] taxi at 1:00PM Do you have the ability to pay for your medications: Yes,  pt has [employer]  Release of information consent forms completed and in the chart;  Patient's signature needed at discharge.  Patient to Follow up at:  Follow-up Information     [place] Follow up.   Specialty: Professional Counselor Why: Please go to this provider for therapy services during walk in hours for new patients:  Monday through Friday, from 9 am to 1 pm. Please go to this provider to esablish care on Monday 11/25 at 9:00AM. Contact information: [address] [place] [address] [phone]         [facility]. Go to.   Specialty: Behavioral Health Why: Please go to this provider for medication management services.  For fastest service, please go on Monday through Friday, arrive by 7:00 am for same day service. Please go to this provider to establish care on Tuesday 11/26 at 7:00AM. Contact information: [address] [place] [address] [phone]                Next level of care provider has access to [facility] Link:no  Safety Planning and Suicide Prevention discussed: No. Pt declined consents     Has patient been referred to the Quitline?: Patient refused referral for treatment. Pt reports using nicotine products but declined resource to quit.   Patient has been referred for addiction treatment: Patient refused referral for treatment. Pt uses substances but declined substance use treatment.   Kathi Der, LCSWA 07/08/2023, 9:35 AM

## 2023-07-08 NOTE — Discharge Summary (Signed)
Physician Discharge Summary Note  Patient:  Eric Livingston is an 47 y.o., male MRN:  [id number] DOB:  [birth year] Patient phone:  [phone] (home)  Patient address:   [address] [place] [address],  Total Time spent with patient: 30 minutes  Date of Admission:  07/02/2023 Date of Discharge: 07/08/2023  Reason for Admission:    Chief Complaint: "Lately my posttraumatic stress has been bad.  I am not taking my medicine.  I had lots of losses in my life."    History of Present Illness: Eric Livingston is a 47 y.o. who  has a past medical history of Alcohol abuse, Depression, Hyperlipidemia, Hypertension, Polysubstance abuse (HCC), and PTSD (post-traumatic stress disorder).  He presented to [facility] for PTSD (post-traumatic stress disorder).  Principal Problem: PTSD (post-traumatic stress disorder) Discharge Diagnoses: Principal Problem:   PTSD (post-traumatic stress disorder) Active Problems:   Polysubstance dependence (HCC)   Suicidal thoughts   Essential hypertension, benign   Major depressive disorder, recurrent episode, severe (HCC)   Past Psychiatric History: He  has a past medical history of Alcohol abuse, Depression, Hyperlipidemia, Hypertension, Polysubstance abuse (HCC), and PTSD (post-traumatic stress disorder).   Past Medical History:  Past Medical History:  Diagnosis Date   Alcohol abuse    Depression    Hyperlipidemia    Hypertension    Polysubstance abuse (HCC)    PTSD (post-traumatic stress disorder)     Past Surgical History:  Procedure Laterality Date   HERNIA REPAIR     Family History: History reviewed. No pertinent family history. Family Psychiatric  History: Please see H&P Social History:  Social History   Substance and Sexual Activity  Alcohol Use Yes   Comment: daily     Social History   Substance and Sexual Activity  Drug Use Yes   Types: Marijuana, Cocaine   Comment: ice, heroin    Social History   Socioeconomic  History   Marital status: Single    Spouse name: Not on file   Number of children: Not on file   Years of education: Not on file   Highest education level: Not on file  Occupational History   Not on file  Tobacco Use   Smoking status: Every Day    Current packs/day: 1.00    Average packs/day: 1 pack/day for 15.0 years (15.0 ttl pk-yrs)    Types: Cigarettes   Smokeless tobacco: Current   Tobacco comments:    Pt declined teaching for cessation of smoking  Substance and Sexual Activity   Alcohol use: Yes    Comment: daily   Drug use: Yes    Types: Marijuana, Cocaine    Comment: ice, heroin   Sexual activity: Yes    Birth control/protection: None  Other Topics Concern   Not on file  Social History Narrative   Not on file   Social Determinants of Health   Financial Resource Strain: High Risk (03/02/2023)   Received from [facility]   Overall Financial Resource Strain (CARDIA)    Difficulty of Paying Living Expenses: Hard  Food Insecurity: No Food Insecurity (07/02/2023)   Hunger Vital Sign    Worried About Running Out of Food in the Last Year: Never true    Ran Out of Food in the Last Year: Never true  Recent Concern: Food Insecurity - High Risk (05/05/2023)   Received from [facility]   Food Insecurity    Within the past 12 months, did the food you bought  just not last and you didn't have money to get more?: Yes    Within the past 12 months, did you worry that your food would run out before you got money to buy more?: Yes  Transportation Needs: No Transportation Needs (07/02/2023)   PRAPARE - Administrator, Civil Service (Medical): No    Lack of Transportation (Non-Medical): No  Recent Concern: Transportation Needs - High Risk (05/05/2023)   Received from [facility]   Transportation Needs    Within the past 12 months, has a lack of transportation kept you from medical appointments or from doing things needed for  daily living?: Yes  Physical Activity: Inactive (05/06/2023)   Received from [facility]   Exercise Vital Sign    Days of Exercise per Week: 0 days    Minutes of Exercise per Session: 0 min  Stress: Stress Concern Present (05/06/2023)   Received from [facility]   [facility] Occupational Health - Occupational Stress Questionnaire    Feeling of Stress : Very much  Social Connections: Socially Isolated (05/06/2023)   Received from [facility]   Social Connection and Isolation Panel [NHANES]    Frequency of Communication with Friends and Family: Once a week    Frequency of Social Gatherings with Friends and Family: Never    Attends Religious Services: 1 to 4 times per year    Active Member of [employer] or Organizations: No    Attends Banker Meetings: Never    Marital Status: Never married    Hospital Course:  During the patient's hospitalization, patient had extensive initial psychiatric evaluation, and follow-up psychiatric evaluations every day.  Psychiatric diagnoses provided upon initial assessment: Depression and flashbacks  Patient's psychiatric medications were adjusted on admission: Zoloft and Minipress  During the hospitalization, other adjustments were made to the patient's psychiatric medication regimen: Zoloft was started at 50 mg a day and Minipress at 1 mg at bedtime.  Denied  Patient's care was discussed during the interdisciplinary team meeting every day during the hospitalization.  The patient denied having side effects to prescribed psychiatric medication.  Gradually, patient started adjusting to milieu. The patient was evaluated each day by a clinical provider to ascertain response to treatment. Improvement was noted by the patient's report of decreasing symptoms, improved sleep and appetite, affect, medication tolerance, behavior, and participation in unit programming.  Patient was asked each day to  complete a self inventory noting mood, mental status, pain, new symptoms, anxiety and concerns.    Symptoms were reported as significantly decreased or resolved completely by discharge.   On day of discharge, the patient reports that their mood is stable. The patient denied having suicidal thoughts for more than 48 hours prior to discharge.  Patient denies having homicidal thoughts.  Patient denies having auditory hallucinations.  Patient denies any visual hallucinations or other symptoms of psychosis. The patient was motivated to continue taking medication with a goal of continued improvement in mental health.   The patient reports their target psychiatric symptoms of PTSD and depression responded well to the psychiatric medications, and the patient reports overall benefit other psychiatric hospitalization. Supportive psychotherapy was provided to the patient. The patient also participated in regular group therapy while hospitalized. Coping skills, problem solving as well as relaxation therapies were also part of the unit programming. During this hospitalization no acute withdrawal symptoms were noted.  His blood pressure improved since admission and prior to  discharge was 115/66.  He was on 2 antihypertensives.  Patient also endorses stress source living at the [facility] because he thought that a lot of people using drugs at the [facility].  He did not want to go back to an inpatient rehab but was willing to go to a shelter and tried to continue to work on outpatient rehab. Labs were reviewed with the patient, and abnormal results were discussed with the patient.  The patient is able to verbalize their individual safety plan to this provider.  # It is recommended to the patient to continue psychiatric medications as prescribed, after discharge from the hospital.    # It is recommended to the patient to follow up with your outpatient psychiatric provider and PCP.  # It was discussed with the  patient, the impact of alcohol, drugs, tobacco have been there overall psychiatric and medical wellbeing, and total abstinence from substance use was recommended the patient.ed.  # Prescriptions provided or sent directly to preferred pharmacy at discharge. Patient agreeable to plan. Given opportunity to ask questions. Appears to feel comfortable with discharge.    # In the event of worsening symptoms, the patient is instructed to call the crisis hotline, 911 and or go to the nearest ED for appropriate evaluation and treatment of symptoms. To follow-up with primary care provider for other medical issues, concerns and or health care needs  # Patient was discharged to self-care with a plan to follow up as noted below.   Physical Findings: AIMS:  , ,  ,  ,    CIWA:    COWS:     Musculoskeletal: Strength & Muscle Tone: within normal limits Gait & Station: normal Patient leans: N/A   Psychiatric Specialty Exam:  Presentation  General Appearance:  Appropriate for Environment  Eye Contact: Fair  Speech: Clear and Coherent  Speech Volume: Normal  Handedness: Right   Mood and Affect  Mood: Euphoric  Affect: Appropriate   Thought Process  Thought Processes: Coherent  Descriptions of Associations:Intact  Orientation:Full (Time, Place and Person)  Thought Content:Logical  History of Schizophrenia/Schizoaffective disorder:No data recorded Duration of Psychotic Symptoms:No data recorded Hallucinations:Hallucinations: None Description of Auditory Hallucinations: Denies  Ideas of Reference:None  Suicidal Thoughts:Suicidal Thoughts: No SI Active Intent and/or Plan: -- (Denies) SI Passive Intent and/or Plan: -- (Denies)  Homicidal Thoughts:Homicidal Thoughts: No   Sensorium  Memory: Immediate Fair; Recent Fair; Remote Fair  Judgment: Fair  Insight: Fair   Art therapist  Concentration: Fair  Attention Span: Fair  Recall: [employer] of  Knowledge: Fair  Language: Fair   Psychomotor Activity  Psychomotor Activity: Psychomotor Activity: Normal   Assets  Assets: Communication Skills; Desire for Improvement   Sleep  Sleep: Sleep: Fair Number of Hours of Sleep: 10    Physical Exam: Physical Exam Constitutional:      Appearance: Normal appearance.  Neurological:     General: No focal deficit present.     Mental Status: He is alert and oriented to person, place, and time. Mental status is at baseline.  Psychiatric:        Mood and Affect: Mood normal.        Behavior: Behavior normal.        Thought Content: Thought content normal.    Review of Systems  Psychiatric/Behavioral: Negative.    All other systems reviewed and are negative.  Blood pressure 128/82, pulse 97, temperature 98.1 F (36.7 C), temperature source Oral, resp. rate 20, height 6'  3" (1.905 m), weight (!) 150.1 kg, SpO2 100%. Body mass index is 41.37 kg/m.   Social History   Tobacco Use  Smoking Status Every Day   Current packs/day: 1.00   Average packs/day: 1 pack/day for 15.0 years (15.0 ttl pk-yrs)   Types: Cigarettes  Smokeless Tobacco Current  Tobacco Comments   Pt declined teaching for cessation of smoking   Tobacco Cessation:  A prescription for an FDA-approved tobacco cessation medication provided at discharge   Blood Alcohol level:  Lab Results  Component Value Date   ETH <10 07/01/2023   ETH <10 05/17/2018    Metabolic Disorder Labs:  No results found for: "HGBA1C", "MPG" No results found for: "PROLACTIN" No results found for: "CHOL", "TRIG", "HDL", "CHOLHDL", "VLDL", "LDLCALC"  See Psychiatric Specialty Exam and Suicide Risk Assessment completed by Attending Physician prior to discharge.  Discharge destination:  Other:  To a shelter  Is patient on multiple antipsychotic therapies at discharge:  No   Has Patient had three or more failed trials of antipsychotic monotherapy by history:  No  Recommended  Plan for Multiple Antipsychotic Therapies: NA     Follow-up Information     [place] Follow up.   Specialty: Professional Counselor Why: Please go to this provider for therapy services during walk in hours for new patients:  Monday through Friday, from 9 am to 1 pm. Contact information: [address] [place] [address] [phone]         [facility]. Go to.   Specialty: Behavioral Health Why: Please go to this provider for medication management services.  For fastest service, please go on Monday through Friday, arrive by 7:00 am for same day service. Contact information: [address] [place] [address] [phone]                Follow-up recommendations:  Activity:  As tolerated.  Patient was also strongly encouraged to abstain from using alcohol or drugs.  He was encouraged to take his medications as prescribed.  Comments: The patient was evaluated prior to discharge by the team.  He was alert oriented and cooperative.  He denied depression or anxiety and denied any active SI/HI/AVH.  He was focused on some chronic low back pain but refused any PRNs prior to discharge.  He did contract for safety.  The patient plans to go to the [facility] at discharge.  He agrees to take the medications as prescribed.  Transportation was arranged.  Outpatient follow-ups were made and he has a safety plan.  Prognosis is fair to guarded.  Signed: Rex Kras, MD 07/08/2023, 9:18 AM

## 2023-07-08 NOTE — Plan of Care (Signed)
Problem: Education: Goal: Emotional status will improve Outcome: Progressing Goal: Mental status will improve Outcome: Progressing   Problem: Coping: Goal: Ability to verbalize frustrations and anger appropriately will improve Outcome: Progressing Goal: Ability to demonstrate self-control will improve Outcome: Progressing

## 2023-07-08 NOTE — BHH Suicide Risk Assessment (Signed)
[facility] Discharge Suicide Risk Assessment   Principal Problem: PTSD (post-traumatic stress disorder) Discharge Diagnoses: Principal Problem:   PTSD (post-traumatic stress disorder) Active Problems:   Polysubstance dependence (HCC)   Suicidal thoughts   Essential hypertension, benign   Major depressive disorder, recurrent episode, severe (HCC)   Total Time spent with patient: 30 minutes  Musculoskeletal: Strength & Muscle Tone: within normal limits Gait & Station: normal Patient leans: N/A  Psychiatric Specialty Exam  Presentation  General Appearance:  Casual; Fairly Groomed  Eye Contact: Good  Speech: Clear and Coherent  Speech Volume: Normal  Handedness: Right   Mood and Affect  Mood: Euthymic  Duration of Depression Symptoms: No data recorded Affect: Congruent   Thought Process  Thought Processes: Coherent  Descriptions of Associations:Intact  Orientation:Full (Time, Place and Person)  Thought Content:Logical  History of Schizophrenia/Schizoaffective disorder:No data recorded Duration of Psychotic Symptoms:No data recorded Hallucinations:Hallucinations: None Description of Auditory Hallucinations: Denies  Ideas of Reference:None  Suicidal Thoughts:Suicidal Thoughts: No SI Active Intent and/or Plan: -- (Denies) SI Passive Intent and/or Plan: -- (Denies)  Homicidal Thoughts:Homicidal Thoughts: No   Sensorium  Memory: Immediate Good; Recent Good  Judgment: Fair  Insight: Fair   Art therapist  Concentration: Good  Attention Span: Good  Recall: Fair  Fund of Knowledge: Fair  Language: Good   Psychomotor Activity  Psychomotor Activity: Psychomotor Activity: Normal   Assets  Assets: Communication Skills; Desire for Improvement; Physical Health; Resilience   Sleep  Sleep: Sleep: Good Number of Hours of Sleep: 10   Physical Exam: Physical Exam Constitutional:      Appearance: Normal appearance.  HENT:      Head: Normocephalic.  Neurological:     General: No focal deficit present.     Mental Status: He is alert and oriented to person, place, and time. Mental status is at baseline.    Review of Systems  Psychiatric/Behavioral:  Negative for depression, hallucinations and suicidal ideas. The patient is not nervous/anxious.   All other systems reviewed and are negative.  Blood pressure 128/82, pulse 97, temperature 98.1 F (36.7 C), temperature source Oral, resp. rate 20, height 6\' 3"  (1.905 m), weight (!) 150.1 kg, SpO2 100%. Body mass index is 41.37 kg/m.  Mental Status Per Nursing Assessment::   On Admission:  Suicidal ideation indicated by patient  Demographic Factors:  Male, Low socioeconomic status, Living alone, and Unemployed  Loss Factors: NA  Historical Factors: Impulsivity  Risk Reduction Factors:   NA  Continued Clinical Symptoms:  Alcohol/Substance Abuse/Dependencies  Cognitive Features That Contribute To Risk:  None    Suicide Risk:  Mild:  Suicidal ideation of limited frequency, intensity, duration, and specificity.  There are no identifiable plans, no associated intent, mild dysphoria and related symptoms, good self-control (both objective and subjective assessment), few other risk factors, and identifiable protective factors, including available and accessible social support.   Follow-up Information     [place] Follow up.   Specialty: Professional Counselor Why: Please go to this provider for therapy services during walk in hours for new patients:  Monday through Friday, from 9 am to 1 pm. Contact information: [address] [place] [address] [phone]         [facility]. Go to.   Specialty: Behavioral Health Why: Please go to this provider for medication management services.  For fastest service, please go on Monday through Friday, arrive by 7:00 am for same day  service. Contact information:  [address] [place] [address] [phone]                Plan Of Care/Follow-up recommendations:  Activity:  As tolerated Other:  Abstain from Drugs and Alcohol.  Rex Kras, MD 07/08/2023, 9:14 AM

## 2023-07-08 NOTE — Progress Notes (Signed)
   07/08/23 0553  Vital Signs  Pulse Rate 93  BP (!) 159/121  BP Location Left Arm  BP Method Automatic  Patient Position (if appropriate) Standing    Administered PRN Clonidine per MAR per patient request.

## 2023-07-08 NOTE — Progress Notes (Signed)
   07/08/23 0800  Psych Admission Type (Psych Patients Only)  Admission Status Voluntary  Psychosocial Assessment  Patient Complaints Depression  Eye Contact Fair  Facial Expression Flat  Affect Flat  Speech Logical/coherent  Interaction Assertive  Motor Activity Other (Comment) (WDL)  Appearance/Hygiene Unremarkable  Behavior Characteristics Cooperative  Mood Depressed  Thought Process  Coherency WDL  Content WDL  Delusions None reported or observed  Perception WDL  Hallucination None reported or observed  Judgment Impaired  Confusion None  Danger to Self  Current suicidal ideation? Denies  Agreement Not to Harm Self No  Description of Agreement Verbal  Danger to Others  Danger to Others None reported or observed   Patient alert and oriented. Patient reports 0/10 anxiety and depression. Patient denies SI, HI, AVH. 7/10 chronic pain reported in his back, patient refused prn at this time. Scheduled medications administered to patient, per provider orders. Support and encouragement provided.  Routine safety checks conducted every 15 minutes. Patient verbally contracts for safety and is compliant with medications and treatment plan. Patient remains safe on the unit.

## 2023-07-08 NOTE — Progress Notes (Signed)
Discharge paperwork reviewed with patient and education provided. Patient verbalized understanding. Resources and contact information were reviewed with patient. Patient contracted for safety and denied SI/HI/AVH prior to discharge. Belongings were returned to patient and inventory sheet was signed by patient and this RN. Patient was escorted to lobby where he met his taxi arranged via social work.

## 2023-07-08 NOTE — Progress Notes (Signed)
   07/07/23 2120  Psych Admission Type (Psych Patients Only)  Admission Status Voluntary  Psychosocial Assessment  Patient Complaints Depression  Eye Contact Fair  Facial Expression Pained  Affect Depressed  Speech Logical/coherent  Interaction Assertive  Motor Activity Other (Comment) (WDL)  Appearance/Hygiene Unremarkable  Behavior Characteristics Cooperative  Mood Depressed  Thought Process  Coherency WDL  Content WDL  Delusions None reported or observed  Perception WDL  Hallucination None reported or observed  Judgment Impaired  Confusion None  Danger to Self  Current suicidal ideation? Denies  Danger to Others  Danger to Others None reported or observed

## 2023-07-08 NOTE — Progress Notes (Signed)
  Patient presented with headache. Administered PRN Ibuprofen per [facility] per patient request.

## 2023-07-08 NOTE — Transportation (Signed)
07/08/2023  Eric Livingston DOB: [birth year] MRN: [id number]   RIDER WAIVER AND RELEASE OF LIABILITY  For the purposes of helping with transportation needs, [facility] partners with outside transportation providers (taxi companies, [place], Catering [employer].) to give [employer] patients or other approved people the choice of on-demand rides [facility]") to our buildings for non-emergency visits.  By using [employer], I, the person signing this document, on behalf of myself and/or any legal minors (in my care using the [employer]), agree:  Science writer given to me are supplied by independent, outside transportation providers who do not work for, or have any affiliation with, [employer]. [place] is not a transportation company. [facility] has no control over the quality or safety of the rides I get using [employer]. [facility] has no control over whether any outside ride will happen on time or not. [facility] gives no guarantee on the reliability, quality, safety, or availability on any rides, or that no mistakes will happen. I know and accept that traveling by vehicle (car, truck, [employer], [employer] Niece, bus, taxi, etc.) has risks of serious injuries such as disability, being paralyzed, and death. I know and agree the risk of using [employer] is mine alone, and not [employer]. Transport Services are provided "as is" and as are available. The transportation providers are in charge for all inspections and care of the vehicles used to provide these rides. I agree not to take legal action against [facility], its agents, employees, officers, directors, representatives, insurers, attorneys, assigns, successors, subsidiaries, and affiliates at any time for any reasons related directly or indirectly to using [employer]. I also agree not to take legal action against [facility] or its affiliates for any injury, death, or damage to property caused by or related to using  [employer]. I have read this Waiver and Release of Liability, and I understand the terms used in it and their legal meaning. This Waiver is freely and voluntarily given with the understanding that my right (or any legal minors) to legal action against [facility] relating to [employer] is knowingly given up to use these services.   I attest that I read the Ride Waiver and Release of Liability to Eric Livingston, gave Mr. Dudik the opportunity to ask questions and answered the questions asked (if any). I affirm that Eric Livingston then provided consent for assistance with transportation.

## 2023-07-08 NOTE — Progress Notes (Signed)
   07/08/23 0545  15 Minute Checks  Location Bedroom  Visual Appearance Calm  Behavior Composed  Sleep (Behavioral Health Patients Only)  Calculate sleep? (Click Yes once per 24 hr at 0600 safety check) Yes  Documented sleep last 24 hours 8.25
